# Patient Record
Sex: Male | Born: 1985 | Race: White | Hispanic: No | Smoking: Former smoker
Health system: Southern US, Community
[De-identification: ages and names within clinical notes are randomized; demographics above are authoritative.]

## PROBLEM LIST (undated history)

## (undated) DIAGNOSIS — F1911 Other psychoactive substance abuse, in remission: Secondary | ICD-10-CM

## (undated) DIAGNOSIS — I10 Essential (primary) hypertension: Secondary | ICD-10-CM

## (undated) HISTORY — DX: Essential (primary) hypertension: I10

## (undated) HISTORY — DX: Other psychoactive substance abuse, in remission: F19.11

---

## 2011-08-16 DIAGNOSIS — F1911 Other psychoactive substance abuse, in remission: Secondary | ICD-10-CM

## 2011-08-16 HISTORY — DX: Other psychoactive substance abuse, in remission: F19.11

## 2013-06-25 ENCOUNTER — Ambulatory Visit (INDEPENDENT_AMBULATORY_CARE_PROVIDER_SITE_OTHER): Payer: BC Managed Care – PPO | Admitting: Family Medicine

## 2013-06-25 ENCOUNTER — Encounter: Payer: Self-pay | Admitting: Family Medicine

## 2013-06-25 ENCOUNTER — Ambulatory Visit: Payer: Self-pay | Admitting: Family Medicine

## 2013-06-25 ENCOUNTER — Encounter (INDEPENDENT_AMBULATORY_CARE_PROVIDER_SITE_OTHER): Payer: Self-pay

## 2013-06-25 VITALS — BP 116/66 | HR 72 | Temp 97.0°F | Ht 72.0 in | Wt 285.8 lb

## 2013-06-25 DIAGNOSIS — I10 Essential (primary) hypertension: Secondary | ICD-10-CM

## 2013-06-25 MED ORDER — LISINOPRIL-HYDROCHLOROTHIAZIDE 10-12.5 MG PO TABS: 1.0000 | ORAL_TABLET | Freq: Every day | ORAL | Status: DC

## 2013-06-25 NOTE — Progress Notes (Signed)
  Subjective:    Patient ID: Darrell Henry, male    DOB: [birth year], 27 y.o.   MRN: [id number]  HPI This 27 y.o. male presents for evaluation of hypertension.  He just moved Down from up [place] and was seen in the last 3 months.  He needs refill On his bp medicine.   Review of Systems No chest pain, SOB, HA, dizziness, vision change, N/V, diarrhea, constipation, dysuria, urinary urgency or frequency, myalgias, arthralgias or rash.     Objective:   Physical Exam  Vital signs noted  Well developed well nourished male.  HEENT - Head atraumatic Normocephalic                Eyes - PERRLA, Conjuctiva - clear Sclera- Clear EOMI                Ears - EAC's Wnl TM's Wnl Gross Hearing WNL                Nose - Nares patent                 Throat - oropharanx wnl Respiratory - Lungs CTA bilateral Cardiac - RRR S1 and S2 without murmur GI - Abdomen soft Nontender and bowel sounds active x 4 Extremities - No edema. Neuro - Grossly intact.      Assessment & Plan:  Essential hypertension, benign - Plan: lisinopril-hydrochlorothiazide (PRINZIDE,ZESTORETIC) 10-12.5 MG per tablet.  Follow up in 6 months.  Deatra Canter FNP

## 2013-06-25 NOTE — Patient Instructions (Signed)

## 2013-12-24 ENCOUNTER — Ambulatory Visit: Payer: BC Managed Care – PPO | Admitting: Family Medicine

## 2014-02-13 ENCOUNTER — Ambulatory Visit (INDEPENDENT_AMBULATORY_CARE_PROVIDER_SITE_OTHER): Payer: Self-pay | Admitting: Family Medicine

## 2014-02-13 VITALS — BP 138/64 | HR 87 | Temp 98.2°F | Resp 18 | Ht 72.0 in | Wt 286.6 lb

## 2014-02-13 DIAGNOSIS — F1911 Other psychoactive substance abuse, in remission: Secondary | ICD-10-CM

## 2014-02-13 DIAGNOSIS — I10 Essential (primary) hypertension: Secondary | ICD-10-CM

## 2014-02-13 DIAGNOSIS — E669 Obesity, unspecified: Secondary | ICD-10-CM

## 2014-02-13 DIAGNOSIS — R0789 Other chest pain: Secondary | ICD-10-CM

## 2014-02-13 DIAGNOSIS — R0683 Snoring: Secondary | ICD-10-CM

## 2014-02-13 DIAGNOSIS — Z0289 Encounter for other administrative examinations: Secondary | ICD-10-CM

## 2014-02-13 NOTE — Progress Notes (Signed)
Patient ID: Darrell PettiesJeffrey Fetty, male   DOB: 06-05-1986, 28 y.o.   MRN: [id number]   Subjective:    Patient ID: Darrell Henry, male    DOB: 06-05-1986, 28 y.o.   MRN: [id number] This chart was scribed for Ethelda ChickKristi M Silvie Obremski, MD by Modena JanskyAlbert Thayil, ED Scribe. This patient was seen in Room 5 and the patient's care was started at 12:33 PM. 02/13/2014  DOT physical  HPI HPI Comments: Darrell Henry is a 28 y.o. male witwho presents to the [facility] complaining of a physical DOT.  Pt's last DOT physical was 13 months ago.   Pt states that he has being having episodes of HTN.  Denies AMI, CVA.   He also reports of episodes of neck pain and tingling. He reports no hx of any hospital visit involving an overnight stay. He reports no recent episodes of seizures.   He reports that his mother is alive and has DM and HTN. He states that his father has a hx of HTN and passed away at age 29 as result of DM. He also states that he has siblings with no medical problems. He reports that he recently quit smoking, and that he started when he was age 28.   He also reports no recent alcohol use and a DWI at age 28. He states that he currently does not use any other recreational drugs and that he has been clean from heroine for two years; he has been maintained on Suboxone for two years. He reports that he walks his dog, but does no other physical activity. He states that he takes Buprenorphrine and Lisinopril.  He admits that he did not report Suboxone use to DOT provider last year.   Pt denies any HA, dizziness, or SOB. He also denies any abdominal pain or trouble sleeping. He states that he just moved here 10 months ago from [place].    He reports of an episode of moderate chest pain that occurred about a month ago. He states that he was sitting prior to the episode that there were no modifying factors. He described the pain as "uncomfortable" and reports no SOB during the episode.  No exertional chest pain  and walks dog daily.  +snoring; +naps frequently; non-restorative sleep at times.  Mild intermittent hypersomnolence.  PC[place]  Review of Systems  Constitutional: Negative for fever, chills, diaphoresis and fatigue.  HENT: Negative for congestion, ear pain, hearing loss, postnasal drip, sinus pressure and tinnitus.   Eyes: Negative for photophobia and visual disturbance.  Respiratory: Negative for apnea, cough and shortness of breath.   Cardiovascular: Positive for chest pain. Negative for palpitations and leg swelling.  Gastrointestinal: Negative for nausea, vomiting, abdominal pain, diarrhea and constipation.  Endocrine: Negative for polydipsia, polyphagia and polyuria.  Genitourinary: Negative for discharge, penile swelling and testicular pain.  Musculoskeletal: Positive for neck pain. Negative for arthralgias, back pain, gait problem, joint swelling, myalgias and neck stiffness.  Skin: Negative for color change, pallor, rash and wound.  Neurological: Negative for dizziness, tremors, seizures, syncope, speech difficulty, weakness, light-headedness, numbness and headaches.  Psychiatric/Behavioral: Negative for sleep disturbance and dysphoric mood. The patient is not nervous/anxious.     Past Medical History  Diagnosis Date  . Hypertension   History reviewed. No pertinent past surgical history.  No Known Allergies Current Outpatient Prescriptions  Medication Sig Dispense Refill  . Buprenorphine HCl-Naloxone HCl (SUBOXONE) 8-2 MG FILM Place under the tongue. RX by Dr Warrick ParisianSheila Stallings  [facility]      . lisinopril-hydrochlorothiazide (PRINZIDE,ZESTORETIC) 10-12.5 MG per tablet Take 1 tablet by mouth daily.  30 tablet  11   No current facility-administered medications for this visit.   History   Social History  . Marital Status: Single    Spouse Name: N/A    Number of Children: N/A  . Years of Education: N/A   Occupational History  . Not on file.    Social History Main Topics  . Smoking status: Former Smoker -- 15 years    Types: Cigarettes  . Smokeless tobacco: Not on file     Comment: E CIG  . Alcohol Use: No     Comment: DWI age 28; no drinking since DWI.  Marland Kitchen. Drug Use: No     Comment: previous heroine addiction and narcotic addiction; Suboxone since 2013.  Marland Kitchen. Sexual Activity: Not on file   Other Topics Concern  . Not on file   Social History Narrative   Heroine abuse; suboxone since 2013.     Family History  Problem Relation Age of Onset  . Diabetes Mother   . Hypertension Mother   . Diabetes Father   . Hypertension Father         Objective:    BP 138/64  Pulse 87  Temp(Src) 98.2 F (36.8 C) (Oral)  Resp 18  Ht 6' (1.829 m)  Wt 286 lb 9.6 oz (130.001 kg)  BMI 38.86 kg/m2  SpO2 95% Physical Exam  Nursing note and vitals reviewed. Constitutional: He is oriented to person, place, and time. He appears well-developed and well-nourished. No distress.  obese  HENT:  Head: Normocephalic and atraumatic.  Right Ear: External ear normal.  Left Ear: External ear normal.  Nose: Nose normal.  Mouth/Throat: Oropharynx is clear and moist. No oropharyngeal exudate.  Eyes: Conjunctivae and EOM are normal. Pupils are equal, round, and reactive to light.  Neck: Normal range of motion. Neck supple. Carotid bruit is not present. No thyromegaly present.  No meningismus.  Cardiovascular: Normal rate, regular rhythm, normal heart sounds and intact distal pulses.  Exam reveals no gallop and no friction rub.   No murmur heard. Pulmonary/Chest: Effort normal and breath sounds normal. No respiratory distress. He has no wheezes. He has no rales.  Abdominal: Soft. Bowel sounds are normal. He exhibits no distension and no mass. There is no tenderness. There is no rebound and no guarding. Hernia confirmed negative in the right inguinal area.  Genitourinary: Testes normal. Circumcised.  Musculoskeletal: Normal range of motion. He  exhibits no edema and no tenderness.       Right shoulder: Normal.       Left shoulder: Normal.       Right knee: Normal.       Left knee: Normal.       Cervical back: Normal.       Thoracic back: Normal.       Lumbar back: Normal.  Lymphadenopathy:    He has no cervical adenopathy.       Right: No inguinal adenopathy present.  Neurological: He is alert and oriented to person, place, and time. He has normal reflexes. No cranial nerve deficit. He exhibits normal muscle tone. Coordination normal.  No ataxia on finger to nose bilaterally. No pronator drift. 5/5 strength throughout. CN 2-12 intact. Negative Romberg. Equal grip strength. Sensation intact. Gait is normal.   Skin: Skin is warm and dry. No rash noted. He is not diaphoretic.  Psychiatric: He has a  normal mood and affect. His behavior is normal. Judgment and thought content normal.   No results found for this or any previous visit.     Assessment & Plan:  Health examination of defined subpopulation  Essential hypertension, benign  Other chest pain  Obesity, unspecified  Snoring  H/O: substance abuse  1.  DOT CPE:  3 month card provided only due to suboxone use, obesity, snoring, chest pain.  Will contact [employer] regarding clearance with Suboxone use; likely will be disqualified because similar to methadone indications. 2.  HTN: controlled. 3. Obesity:  Recommend weight loss; warrants sleep study. 4.  Snoring:  New.  Associated with obesity, HTN.  Needs sleep study for extension of DOT card. 5.  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 6.  History of DWIs:  Three years ago.  No current signs of alcoholism; will also clarify with [employer] if needs ongoing counseling with [facility].    No orders of the defined types were placed in this encounter.    No  Follow-up on file.   I personally performed the services described in this documentation, which was scribed in my presence.  The recorded information has been reviewed and is accurate.  Nilda Simmer, M.D.  [facility] [address] [place], [place]  [address] [phone] phone [phone] fax

## 2014-02-13 NOTE — Patient Instructions (Signed)
1.  Follow-up with your primary care provider to discuss your recent chest pain and snoring.   2.  Due to obesity, snoring, hypertension and also due to your Commercial Driver's License, I recommend you undergoing a sleep study to evaluate for sleep apnea.  I also recommend weight loss. 3. I am giving you a three month DOT card to allow you time to undergo an office visit with your primary provider to evaluate your chest pain and to also schedule a sleep study.   4. Please call my office and leave a message when you have completed the following steps.

## 2014-06-19 ENCOUNTER — Other Ambulatory Visit: Payer: Self-pay | Admitting: Family Medicine

## 2014-07-20 ENCOUNTER — Other Ambulatory Visit: Payer: Self-pay | Admitting: Family Medicine

## 2014-07-21 NOTE — Telephone Encounter (Signed)
Refilled. Appt for 12/8 w/ Bill Oxford.

## 2014-07-22 ENCOUNTER — Ambulatory Visit (INDEPENDENT_AMBULATORY_CARE_PROVIDER_SITE_OTHER): Payer: BC Managed Care – PPO | Admitting: Family Medicine

## 2014-07-22 ENCOUNTER — Ambulatory Visit (INDEPENDENT_AMBULATORY_CARE_PROVIDER_SITE_OTHER): Payer: BC Managed Care – PPO | Admitting: *Deleted

## 2014-07-22 VITALS — BP 105/60 | HR 72 | Temp 97.7°F | Ht 72.0 in | Wt 287.6 lb

## 2014-07-22 DIAGNOSIS — Z23 Encounter for immunization: Secondary | ICD-10-CM

## 2014-07-22 DIAGNOSIS — I10 Essential (primary) hypertension: Secondary | ICD-10-CM

## 2014-07-22 LAB — POCT CBC
Granulocyte percent: 64.4 %G (ref 37–80)
HCT, POC: 42.7 % — AB (ref 43.5–53.7)
Hemoglobin: 14.5 g/dL (ref 14.1–18.1)
Lymph, poc: 1.8 (ref 0.6–3.4)
MCH, POC: 30 pg (ref 27–31.2)
MCHC: 33.9 g/dL (ref 31.8–35.4)
MCV: 88.5 fL (ref 80–97)
MPV: 7.9 fL (ref 0–99.8)
POC Granulocyte: 3.9 (ref 2–6.9)
POC LYMPH PERCENT: 28.9 %L (ref 10–50)
Platelet Count, POC: 233 10*3/uL (ref 142–424)
RBC: 4.8 M/uL (ref 4.69–6.13)
RDW, POC: 12.6 %
WBC: 6.1 10*3/uL (ref 4.6–10.2)

## 2014-07-22 MED ORDER — LISINOPRIL-HYDROCHLOROTHIAZIDE 10-12.5 MG PO TABS: 1.0000 | ORAL_TABLET | Freq: Every day | ORAL | Status: DC

## 2014-07-22 NOTE — Progress Notes (Signed)
   Subjective:    Patient ID: Darrell Henry, male    DOB: 23-Nov-1985, 28 y.o.   MRN: [id number]  HPI Patient is here for follow up on blood pressure.  He is due for lab work. He is due for flu shot.  Review of Systems  Constitutional: Negative for fever.  HENT: Negative for ear pain.   Eyes: Negative for discharge.  Respiratory: Negative for cough.   Cardiovascular: Negative for chest pain.  Gastrointestinal: Negative for abdominal distention.  Endocrine: Negative for polyuria.  Genitourinary: Negative for difficulty urinating.  Musculoskeletal: Negative for gait problem and neck pain.  Skin: Negative for color change and rash.  Neurological: Negative for speech difficulty and headaches.  Psychiatric/Behavioral: Negative for agitation.       Objective:    BP 105/60 mmHg  Pulse 72  Temp(Src) 97.7 F (36.5 C) (Oral)  Ht 6' (1.829 m)  Wt 287 lb 9.6 oz (130.455 kg)  BMI 39.00 kg/m2 Physical Exam  Constitutional: He is oriented to person, place, and time. He appears well-developed and well-nourished.  HENT:  Head: Normocephalic and atraumatic.  Mouth/Throat: Oropharynx is clear and moist.  Eyes: Pupils are equal, round, and reactive to light.  Neck: Normal range of motion. Neck supple.  Cardiovascular: Normal rate and regular rhythm.   No murmur heard. Pulmonary/Chest: Effort normal and breath sounds normal.  Abdominal: Soft. Bowel sounds are normal. There is no tenderness.  Neurological: He is alert and oriented to person, place, and time.  Skin: Skin is warm and dry.  Psychiatric: He has a normal mood and affect.          Assessment & Plan:     ICD-9-CM ICD-10-CM   1. Essential hypertension 401.9 I10 lisinopril-hydrochlorothiazide (PRINZIDE,ZESTORETIC) 10-12.5 MG per tablet     POCT CBC     CMP14+EGFR     Lipid panel     TSH   GET labs and flu shot  No Follow-up on file.  Lysbeth Penner FNP

## 2014-07-23 LAB — CMP14+EGFR
ALT: 46 IU/L — ABNORMAL HIGH (ref 0–44)
AST: 33 IU/L (ref 0–40)
Albumin/Globulin Ratio: 1.9 (ref 1.1–2.5)
Albumin: 4.3 g/dL (ref 3.5–5.5)
Alkaline Phosphatase: 39 IU/L (ref 39–117)
BUN/Creatinine Ratio: 19 (ref 8–19)
BUN: 16 mg/dL (ref 6–20)
CO2: 29 mmol/L (ref 18–29)
Calcium: 9.2 mg/dL (ref 8.7–10.2)
Chloride: 100 mmol/L (ref 97–108)
Creatinine, Ser: 0.86 mg/dL (ref 0.76–1.27)
GFR calc Af Amer: 135 mL/min/{1.73_m2} (ref >59)
GFR calc non Af Amer: 117 mL/min/{1.73_m2} (ref >59)
Globulin, Total: 2.3 g/dL (ref 1.5–4.5)
Glucose: 99 mg/dL (ref 65–99)
Potassium: 4.5 mmol/L (ref 3.5–5.2)
Sodium: 141 mmol/L (ref 134–144)
Total Bilirubin: 0.4 mg/dL (ref 0.0–1.2)
Total Protein: 6.6 g/dL (ref 6.0–8.5)

## 2014-07-23 LAB — LIPID PANEL
Chol/HDL Ratio: 4.7 ratio units (ref 0.0–5.0)
Cholesterol, Total: 170 mg/dL (ref 100–199)
HDL: 36 mg/dL — ABNORMAL LOW (ref >39)
LDL Calculated: 96 mg/dL (ref 0–99)
Triglycerides: 191 mg/dL — ABNORMAL HIGH (ref 0–149)
VLDL Cholesterol Cal: 38 mg/dL (ref 5–40)

## 2014-07-23 LAB — TSH: TSH: 1.85 u[IU]/mL (ref 0.450–4.500)

## 2015-08-06 ENCOUNTER — Encounter: Payer: Self-pay | Admitting: Family Medicine

## 2015-08-06 ENCOUNTER — Ambulatory Visit (INDEPENDENT_AMBULATORY_CARE_PROVIDER_SITE_OTHER): Payer: BLUE CROSS/BLUE SHIELD | Admitting: Family Medicine

## 2015-08-06 VITALS — BP 138/78 | HR 90 | Temp 97.9°F | Ht 73.0 in | Wt 289.8 lb

## 2015-08-06 DIAGNOSIS — Z0000 Encounter for general adult medical examination without abnormal findings: Secondary | ICD-10-CM

## 2015-08-06 DIAGNOSIS — I10 Essential (primary) hypertension: Secondary | ICD-10-CM | POA: Insufficient documentation

## 2015-08-06 MED ORDER — LISINOPRIL-HYDROCHLOROTHIAZIDE 10-12.5 MG PO TABS: 1.0000 | ORAL_TABLET | Freq: Every day | ORAL | Status: DC

## 2015-08-06 NOTE — Patient Instructions (Signed)
Great to meet you!  You have made good lifestyle changes, keep it up!  I think its fine to see you once a year  We will call with your labs within a week.

## 2015-08-06 NOTE — Progress Notes (Signed)
   HPI  Patient presents today to discuss hypertension.  Hypertension Doing well, good medication compliance Portion control, not watching salt Does not check blood pressure home No chest pain, palpitations, leg edema  Obesity Recently started running 15 minutes a day on a treadmill 7 days a week Also doing portion control  He's using Suboxone for addiction treatment  PMH: Smoking status noted ROS: Per HPI  Objective: BP 138/78 mmHg  Pulse 90  Temp(Src) 97.9 F (36.6 C) (Oral)  Ht '6\' 1"'$  (1.854 m)  Wt 289 lb 12.8 oz (131.452 kg)  BMI 38.24 kg/m2 Gen: NAD, alert, cooperative with exam HEENT: NCAT CV: RRR, good S1/S2, no murmur Resp: CTABL, no wheezes, non-labored Ext: No edema, warm Neuro: Alert and oriented, No gross deficits  Assessment and plan:  # Hypertension Blood pressure control Continue Prinzide Labs, nonfasting  # Obesity Congratulated on positive lifestyle changes, continue  # Healthcare maintenance HIV added to labs    Orders Placed This Encounter  Procedures  . CMP14+EGFR  . HIV antibody (with reflex)    Meds ordered this encounter  Medications  . lisinopril-hydrochlorothiazide (PRINZIDE,ZESTORETIC) 10-12.5 MG tablet    Sig: Take 1 tablet by mouth daily.    Dispense:  90 tablet    Refill:  [place], MD [facility] 08/06/2015, 2:46 PM

## 2015-08-07 LAB — CMP14+EGFR
ALT: 52 IU/L — AB (ref 0–44)
AST: 29 IU/L (ref 0–40)
Albumin/Globulin Ratio: 1.6 (ref 1.1–2.5)
Albumin: 4.3 g/dL (ref 3.5–5.5)
Alkaline Phosphatase: 37 IU/L — ABNORMAL LOW (ref 39–117)
BUN/Creatinine Ratio: 17 (ref 8–19)
BUN: 14 mg/dL (ref 6–20)
Bilirubin Total: 0.2 mg/dL (ref 0.0–1.2)
CHLORIDE: 98 mmol/L (ref 96–106)
CO2: 28 mmol/L (ref 18–29)
Calcium: 9.2 mg/dL (ref 8.7–10.2)
Creatinine, Ser: 0.81 mg/dL (ref 0.76–1.27)
GFR calc Af Amer: 138 mL/min/{1.73_m2} (ref >59)
GFR calc non Af Amer: 119 mL/min/{1.73_m2} (ref >59)
GLUCOSE: 152 mg/dL — AB (ref 65–99)
Globulin, Total: 2.7 g/dL (ref 1.5–4.5)
POTASSIUM: 4.3 mmol/L (ref 3.5–5.2)
Sodium: 140 mmol/L (ref 134–144)
Total Protein: 7 g/dL (ref 6.0–8.5)

## 2015-08-07 LAB — HIV ANTIBODY (ROUTINE TESTING W REFLEX): HIV Screen 4th Generation wRfx: NONREACTIVE

## 2015-08-26 ENCOUNTER — Other Ambulatory Visit: Payer: Self-pay | Admitting: Nurse Practitioner

## 2016-04-12 ENCOUNTER — Encounter: Payer: Self-pay | Admitting: Family Medicine

## 2016-04-12 ENCOUNTER — Ambulatory Visit (INDEPENDENT_AMBULATORY_CARE_PROVIDER_SITE_OTHER): Payer: BLUE CROSS/BLUE SHIELD | Admitting: Family Medicine

## 2016-04-12 VITALS — BP 130/77 | HR 95 | Temp 98.6°F | Ht 73.0 in | Wt 299.0 lb

## 2016-04-12 DIAGNOSIS — I10 Essential (primary) hypertension: Secondary | ICD-10-CM | POA: Diagnosis not present

## 2016-04-12 DIAGNOSIS — I878 Other specified disorders of veins: Secondary | ICD-10-CM | POA: Diagnosis not present

## 2016-04-12 NOTE — Patient Instructions (Signed)
Great to see you!  Come back in 6 weeks to discuss weight   You are making good changes, keep it up!

## 2016-04-12 NOTE — Progress Notes (Signed)
   HPI  Patient presents today here with leg swelling.  Patient explains that over the last 6 months he's had leg swelling that seems pretty much stable. There's been no worsening, it is not unilateral and is not bothersome. It's better in the morning.  He is also frustrated with his weight. He states that last week he crossed 300 pounds and this is a big turning point for him he has stopped sodas, fried and fatty foods, and fast food. He is interested in losing weight. He is also interested in appetite suppressant like phentermine.  He has history of IV drug abuse but has not used in 5 years. He denies history of fever, chills, sweats, or palpitations.  Hypertension Good medication compliance No chest pain, dyspnea, palpitations. Leg edema as above.  PMH: Smoking status noted ROS: Per HPI  Objective: BP 130/77   Pulse 95   Temp 98.6 F (37 C) (Oral)   Ht '6\' 1"'$  (1.854 m)   Wt 299 lb (135.6 kg)   BMI 39.45 kg/m  Gen: NAD, alert, cooperative with exam HEENT: NCAT CV: RRR, good S1/S2, no murmur Resp: CTABL, no wheezes, non-labored Abd: SNTND, BS present, no guarding or organomegaly Ext: 1+ pitting edema bilateral lower extremities Neuro: Alert and oriented, No gross deficits  Assessment and plan:  # Venous stasis Leg edema is consistent with venous stasis, likely due to morbid obesity and high salt content in his diet Discussed supportive care, possible compression stockings, but over arching issue is trying to lose weight.  Very careful auscultation of the heart reveals no murmurs, he is at increased risk for endocarditis and/or vegetation given history IV drug abuse. For now I will defer any echo.   # Morbid obesity Discussed not using phentermine with hypertension, and if used using low dose and only for short courses Encouraged diet and exercise Also discussed the possibility of bariatric surgery and medical weight loss therapy. Offered clinical pharmacist for  nutrition therapy today. He would like to pursue his current plan, begin exercising, and return to clinic in 6 weeks for weight check.   # Hypertension Well controlled on Prinzide, continue Labs    Orders Placed This Encounter  Procedures  . CMP14+EGFR  . CBC with Differential  . TSH  . Lipid panel     Laroy Apple, MD [facility] 04/12/2016, 9:41 AM

## 2016-04-13 LAB — TSH: TSH: 4.67 u[IU]/mL — AB (ref 0.450–4.500)

## 2016-04-13 LAB — CMP14+EGFR
ALK PHOS: 40 IU/L (ref 39–117)
ALT: 60 IU/L — AB (ref 0–44)
AST: 38 IU/L (ref 0–40)
Albumin/Globulin Ratio: 1.6 (ref 1.2–2.2)
Albumin: 4.4 g/dL (ref 3.5–5.5)
BUN/Creatinine Ratio: 15 (ref 9–20)
BUN: 13 mg/dL (ref 6–20)
Bilirubin Total: 0.4 mg/dL (ref 0.0–1.2)
CHLORIDE: 95 mmol/L — AB (ref 96–106)
CO2: 27 mmol/L (ref 18–29)
CREATININE: 0.84 mg/dL (ref 0.76–1.27)
Calcium: 9.5 mg/dL (ref 8.7–10.2)
GFR calc Af Amer: 135 mL/min/{1.73_m2} (ref >59)
GFR calc non Af Amer: 117 mL/min/{1.73_m2} (ref >59)
Globulin, Total: 2.8 g/dL (ref 1.5–4.5)
Glucose: 132 mg/dL — ABNORMAL HIGH (ref 65–99)
Potassium: 4.1 mmol/L (ref 3.5–5.2)
Sodium: 138 mmol/L (ref 134–144)
Total Protein: 7.2 g/dL (ref 6.0–8.5)

## 2016-04-13 LAB — CBC WITH DIFFERENTIAL/PLATELET
Basophils Absolute: 0 10*3/uL (ref 0.0–0.2)
Basos: 0 %
EOS (ABSOLUTE): 0.2 10*3/uL (ref 0.0–0.4)
EOS: 2 %
HEMOGLOBIN: 14.9 g/dL (ref 12.6–17.7)
Hematocrit: 43.1 % (ref 37.5–51.0)
IMMATURE GRANS (ABS): 0 10*3/uL (ref 0.0–0.1)
Immature Granulocytes: 0 %
Lymphocytes Absolute: 3 10*3/uL (ref 0.7–3.1)
Lymphs: 34 %
MCH: 30.3 pg (ref 26.6–33.0)
MCHC: 34.6 g/dL (ref 31.5–35.7)
MCV: 88 fL (ref 79–97)
MONOCYTES: 5 %
Monocytes Absolute: 0.4 10*3/uL (ref 0.1–0.9)
Neutrophils Absolute: 5.3 10*3/uL (ref 1.4–7.0)
Neutrophils: 59 %
Platelets: 257 10*3/uL (ref 150–379)
RBC: 4.91 x10E6/uL (ref 4.14–5.80)
RDW: 13.4 % (ref 12.3–15.4)
WBC: 8.9 10*3/uL (ref 3.4–10.8)

## 2016-04-13 LAB — LIPID PANEL
Chol/HDL Ratio: 4.8 ratio units (ref 0.0–5.0)
Cholesterol, Total: 157 mg/dL (ref 100–199)
HDL: 33 mg/dL — AB (ref >39)
LDL Calculated: 67 mg/dL (ref 0–99)
TRIGLYCERIDES: 286 mg/dL — AB (ref 0–149)
VLDL Cholesterol Cal: 57 mg/dL — ABNORMAL HIGH (ref 5–40)

## 2016-04-14 ENCOUNTER — Telehealth: Payer: Self-pay | Admitting: Family Medicine

## 2016-04-14 NOTE — Telephone Encounter (Signed)
Aware of lab results  

## 2016-04-15 LAB — HGB A1C W/O EAG: Hgb A1c MFr Bld: 5.6 % (ref 4.8–5.6)

## 2016-04-15 LAB — SPECIMEN STATUS REPORT

## 2016-05-03 ENCOUNTER — Encounter: Payer: Self-pay | Admitting: *Deleted

## 2016-05-24 ENCOUNTER — Ambulatory Visit (INDEPENDENT_AMBULATORY_CARE_PROVIDER_SITE_OTHER): Payer: BLUE CROSS/BLUE SHIELD | Admitting: Family Medicine

## 2016-05-24 ENCOUNTER — Encounter: Payer: Self-pay | Admitting: Family Medicine

## 2016-05-24 VITALS — BP 113/63 | HR 79 | Temp 98.8°F | Ht 73.0 in | Wt 283.2 lb

## 2016-05-24 DIAGNOSIS — I10 Essential (primary) hypertension: Secondary | ICD-10-CM

## 2016-05-24 DIAGNOSIS — Z23 Encounter for immunization: Secondary | ICD-10-CM

## 2016-05-24 DIAGNOSIS — I878 Other specified disorders of veins: Secondary | ICD-10-CM | POA: Insufficient documentation

## 2016-05-24 NOTE — Progress Notes (Signed)
   HPI  Patient presents today here for follow-up of weight loss of hypertension.  Hypertension Good medication compliance  Weight loss and obesity Patient has lost 16 pounds, he's cut out sodas, he's reduced other sweet foods. He's increased his walks to about 30 minutes 3-4 times a week, he's walking his dog and a shorter walk daily. He's interested in medications if possible  Leg swelling Improving slightly, has history of IV drug abuse and concern that this could be causing it. No palpitations or murmurs.  PMH: Smoking status noted ROS: Per HPI  Objective: BP 113/63   Pulse 79   Temp 98.8 F (37.1 C) (Oral)   Ht 6\' 1"  (1.854 m)   Wt 283 lb 3.2 oz (128.5 kg)   BMI 37.36 kg/m  Gen: NAD, alert, cooperative with exam HEENT: NCAT CV: RRR, good S1/S2, no murmur Resp: CTABL, no wheezes, non-labored Ext: 1 Plus pitting edema bilateral lower extremities symmetric Neuro: Alert and oriented, No gross deficits  Assessment and plan:  # Morbid obesity Weight improving, encouraged to continue positive diet changes, consider saxenda Offered bariatric referral Congratulated on positive changes F/u 2-3 monhs  # HTN Controlled Continue proinzide  # venous stasis Improved slightly, reassurance provided  Murtis SinkSam Sahand Gosch, MD [facility] 05/24/2016, 11:01 AM

## 2016-05-24 NOTE — Patient Instructions (Signed)
Great to see you!  Keep up the good work!  Lets follow up in 2 months  Consider Saxenda, check [email or web address] for coverage

## 2016-07-25 ENCOUNTER — Encounter: Payer: Self-pay | Admitting: Family Medicine

## 2016-07-25 ENCOUNTER — Ambulatory Visit (INDEPENDENT_AMBULATORY_CARE_PROVIDER_SITE_OTHER): Payer: BLUE CROSS/BLUE SHIELD | Admitting: Family Medicine

## 2016-07-25 VITALS — BP 115/58 | HR 80 | Temp 97.0°F | Ht 73.0 in | Wt 281.4 lb

## 2016-07-25 DIAGNOSIS — I10 Essential (primary) hypertension: Secondary | ICD-10-CM

## 2016-07-25 DIAGNOSIS — R0789 Other chest pain: Secondary | ICD-10-CM

## 2016-07-25 NOTE — Progress Notes (Signed)
   HPI  Patient presents today here for weight loss and hypertension, also with chest pain.  Weight loss Repeat lifestyle changes are going very well, still avoiding soda, fast food, and reducing portion sizes. Walking 20 minutes 4 times a week.  Chest pain Described as left-sided chest pain Dull and achy in nature, lasting 1-2 minutes Nonradiating Happens with rest, also happens with exertion, intermittent. Not predictable, sometimes occurs and sometimes doesn't Also new L facial/neck tick ( no radiation of pain) Recently started metal scrapping again.  No palps, sweating, SOB  HTN Good med compliance CP as above  PMH: Smoking status noted ROS: Per HPI  Objective: BP (!) 115/58   Pulse 80   Temp 97 F (36.1 C) (Oral)   Ht 6\' 1"  (1.854 m)   Wt 281 lb 6.4 oz (127.6 kg)   BMI 37.13 kg/m  Gen: NAD, alert, cooperative with exam HEENT: NCAT, EOMI, PERRL CV: RRR, good S1/S2, no murmur Resp: CTABL, no wheezes, non-labored Abd: SNTND, BS present, no guarding or organomegaly Ext: No edema, warm Neuro: Alert and oriented, No gross deficits  Assessment and plan:  # Atypical chest pain EKG within normal limits, very unlikely to be cardiac related I think pectoralis muscle spasms or more likely. This could be due to nerve irritation with his new left-sided facial and neck tic or due to recent increase in physical exertion. Reassurance provided Return to clinic as needed  # Weight loss, morbid obesity Slow progress nail Encouraged to start weight watchers or consider the Atkin's diet Increase intensity of aerobic exercise Return to clinic in 2 months  # Hypertension Well-controlled on Prinzide Continue current medications    Murtis SinkSam Dorothea Yow, MD [facility] 07/25/2016, 10:52 AM

## 2016-07-25 NOTE — Patient Instructions (Signed)
Great to see you!  Come back with any concerns, otherwise lets follow up in 2 months  Keep up the good work!

## 2016-08-17 ENCOUNTER — Encounter: Payer: Self-pay | Admitting: Family Medicine

## 2016-08-17 ENCOUNTER — Other Ambulatory Visit: Payer: Self-pay

## 2016-08-17 ENCOUNTER — Ambulatory Visit (INDEPENDENT_AMBULATORY_CARE_PROVIDER_SITE_OTHER): Payer: BLUE CROSS/BLUE SHIELD | Admitting: Family Medicine

## 2016-08-17 ENCOUNTER — Ambulatory Visit (HOSPITAL_COMMUNITY)
Admission: RE | Admit: 2016-08-17 | Discharge: 2016-08-17 | Disposition: A | Discharge status: Home / Self Care | Payer: BLUE CROSS/BLUE SHIELD | Source: Ambulatory Visit | Attending: Family Medicine | Admitting: Family Medicine

## 2016-08-17 VITALS — BP 108/61 | HR 84 | Temp 97.7°F | Ht 73.0 in | Wt 280.0 lb

## 2016-08-17 DIAGNOSIS — M79605 Pain in left leg: Secondary | ICD-10-CM

## 2016-08-17 DIAGNOSIS — I878 Other specified disorders of veins: Secondary | ICD-10-CM | POA: Diagnosis not present

## 2016-08-17 DIAGNOSIS — R600 Localized edema: Secondary | ICD-10-CM | POA: Diagnosis not present

## 2016-08-17 DIAGNOSIS — R590 Localized enlarged lymph nodes: Secondary | ICD-10-CM | POA: Insufficient documentation

## 2016-08-17 DIAGNOSIS — R2242 Localized swelling, mass and lump, left lower limb: Secondary | ICD-10-CM | POA: Diagnosis not present

## 2016-08-17 MED ORDER — AMOXICILLIN-POT CLAVULANATE 875-125 MG PO TABS: 1.0000 | ORAL_TABLET | Freq: Two times a day (BID) | ORAL | 0 refills | Status: DC

## 2016-08-17 NOTE — Progress Notes (Signed)
Subjective:  Patient ID: Darrell Henry, male    DOB: 09-16-1985  Age: 31 y.o. MRN: [id number]  CC: Leg Swelling (pt here today for left lower leg redness and swelling also a knot in the left inguinal area )   HPI Darrell Henry presents for Symptoms noted above. Onset in the last few days. Noted to not yesterday. Area is moderately sore at the inguinal area somewhat more painful at the lower leg. He's had poor circulation noted in the past.   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   He has no past surgical history on file.   His family history includes Diabetes in his father and mother; Hypertension in his father and mother.He reports that he has quit smoking. His smoking use included Cigarettes. He quit after 15.00 years of use. He has never used smokeless tobacco. He reports that he does not drink alcohol or use drugs.    ROS Review of Systems Normal with the exception of history of present illness   Objective:  BP 108/61   Pulse 84   Temp 97.7 F (36.5 C) (Oral)   Ht 6\' 1"  (1.854 m)   Wt 280 lb (127 kg)   BMI 36.94 kg/m   BP Readings from Last 3 Encounters:  08/23/16 (!) 113/54  08/17/16 108/61  07/25/16 (!) 115/58    Wt Readings from Last 3 Encounters:  08/23/16 278 lb (126.1 kg)  08/17/16 280 lb (127 kg)  07/25/16 281 lb 6.4 oz (127.6 kg)     Physical Exam  Constitutional: He appears well-developed and well-nourished.  HENT:  Head: Normocephalic and atraumatic.  Right Ear: Tympanic membrane and external ear normal. No decreased hearing is noted.  Left Ear: Tympanic membrane and external ear normal. No decreased hearing is noted.  Mouth/Throat: No oropharyngeal exudate or posterior oropharyngeal erythema.  Eyes: Pupils are equal, round, and reactive to light.  Neck: Normal range of motion. Neck supple.  Cardiovascular: Normal rate and regular rhythm.   No murmur heard. Pulmonary/Chest:  Breath sounds normal. No respiratory distress.  Abdominal: Soft. Bowel sounds are normal. He exhibits no mass. There is no tenderness.  Skin: There is erythema (Confluent erythema at the anterior left shin. covering much of the area).  Psychiatric: He has a normal mood and affect.  Vitals reviewed.    Lab Results  Component Value Date   WBC 8.9 04/12/2016   HGB 14.5 07/22/2014   HCT 43.1 04/12/2016   PLT 257 04/12/2016   GLUCOSE 132 (H) 04/12/2016   CHOL 157 04/12/2016   TRIG 286 (H) 04/12/2016   HDL 33 (L) 04/12/2016   LDLCALC 67 04/12/2016   ALT 60 (H) 04/12/2016   AST 38 04/12/2016   NA 138 04/12/2016   K 4.1 04/12/2016   CL 95 (L) 04/12/2016   CREATININE 0.84 04/12/2016   BUN 13 04/12/2016   CO2 27 04/12/2016   TSH 4.670 (H) 04/12/2016   HGBA1C 5.6 04/12/2016    Patient was never admitted.  Assessment & Plan:   Darrell Henry was seen today for leg swelling.  Diagnoses and all orders for this visit:  Leg edema, left -     Ultrasound doppler venous legs bilat; Future  Venous stasis -     Ultrasound doppler venous legs bilat; Future  Morbid obesity (HCC) -     Ultrasound doppler venous legs bilat; Future  Other orders -     Discontinue: amoxicillin-clavulanate (AUGMENTIN) 875-125 MG  tablet; Take 1 tablet by mouth 2 (two) times daily. Take all of this medication    I am having Darrell Henry maintain his lisinopril-hydrochlorothiazide and SUBOXONE.  Meds ordered this encounter  Medications  . DISCONTD: amoxicillin-clavulanate (AUGMENTIN) 875-125 MG tablet    Sig: Take 1 tablet by mouth 2 (two) times daily. Take all of this medication    Dispense:  20 tablet    Refill:  0     Follow-up: Return in about 7 days (around 08/24/2016).  Mechele ClaudeWarren Daziya Redmond, M.D.

## 2016-08-17 NOTE — Progress Notes (Signed)
Patient aware.

## 2016-08-23 ENCOUNTER — Ambulatory Visit (INDEPENDENT_AMBULATORY_CARE_PROVIDER_SITE_OTHER): Payer: BLUE CROSS/BLUE SHIELD | Admitting: Family Medicine

## 2016-08-23 ENCOUNTER — Encounter: Payer: Self-pay | Admitting: Family Medicine

## 2016-08-23 VITALS — BP 113/54 | HR 72 | Temp 97.0°F | Ht 73.0 in | Wt 278.0 lb

## 2016-08-23 DIAGNOSIS — L03116 Cellulitis of left lower limb: Secondary | ICD-10-CM | POA: Diagnosis not present

## 2016-08-23 DIAGNOSIS — I878 Other specified disorders of veins: Secondary | ICD-10-CM | POA: Diagnosis not present

## 2016-08-23 DIAGNOSIS — R600 Localized edema: Secondary | ICD-10-CM

## 2016-08-23 MED ORDER — AMOXICILLIN-POT CLAVULANATE 875-125 MG PO TABS: 1.0000 | ORAL_TABLET | Freq: Two times a day (BID) | ORAL | 0 refills | Status: DC

## 2016-08-23 NOTE — Progress Notes (Signed)
Subjective:  Patient ID: Darrell Henry, male    DOB: 1986-05-26  Age: 31 y.o. MRN: [id number]  CC: Cellulitis (pt here today for 1 week follow up on cellulitis of left lower leg, he is still on the Augmentin)   HPI Darrell Henry. Still has edema LLE. Used to weight about 300 lb. Has had swelling in the leg before but this is the first infection. Doppler result was negative for DVT. Some soreness, no pain. Performing usual activities.   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   He has no past surgical history on file.   His family history includes Diabetes in his father and mother; Hypertension in his father and mother.He reports that he has quit smoking. His smoking use included Cigarettes. He quit after 15.00 years of use. He has never used smokeless tobacco. He reports that he does not drink alcohol or use drugs.    ROS Review of Systems  Constitutional: Negative for chills, diaphoresis and fever.  HENT: Negative for rhinorrhea and sore throat.   Respiratory: Negative for cough and shortness of breath.   Cardiovascular: Negative for chest pain.  Gastrointestinal: Negative for abdominal pain.  Musculoskeletal: Negative for arthralgias and myalgias.  Skin: Negative for rash.  Neurological: Negative for weakness and headaches.    Objective:  BP (!) 113/54   Pulse 72   Temp 97 F (36.1 C) (Oral)   Ht 6\' 1"  (1.854 m)   Wt 278 lb (126.1 kg)   BMI 36.68 kg/m   BP Readings from Last 3 Encounters:  08/23/16 (!) 113/54  08/17/16 108/61  07/25/16 (!) 115/58    Wt Readings from Last 3 Encounters:  08/23/16 278 lb (126.1 kg)  08/17/16 280 lb (127 kg)  07/25/16 281 lb 6.4 oz (127.6 kg)     Physical Exam  Constitutional: He appears well-developed and well-nourished.  HENT:  Head: Normocephalic and atraumatic.  Right Ear: Tympanic membrane and external ear normal. No decreased  hearing is noted.  Left Ear: Tympanic membrane and external ear normal. No decreased hearing is noted.  Mouth/Throat: No posterior oropharyngeal erythema.  Eyes: Pupils are equal, round, and reactive to light.  Neck: Normal range of motion. Neck supple.  Cardiovascular: Normal rate and regular rhythm.   No murmur heard. Pulmonary/Chest: Breath sounds normal. No respiratory distress.  Abdominal: Soft. Bowel sounds are normal. He exhibits no mass. There is no tenderness.  Musculoskeletal: He exhibits edema (2+ LLE to tibial tuberosity).  Skin: There is erythema (has decreased from last check in intensity. Still covers anterior lower left leg).  Vitals reviewed.    Lab Results  Component Value Date   WBC 8.9 04/12/2016   HGB 14.5 07/22/2014   HCT 43.1 04/12/2016   PLT 257 04/12/2016   GLUCOSE 132 (H) 04/12/2016   CHOL 157 04/12/2016   TRIG 286 (H) 04/12/2016   HDL 33 (L) 04/12/2016   LDLCALC 67 04/12/2016   ALT 60 (H) 04/12/2016   AST 38 04/12/2016   NA 138 04/12/2016   K 4.1 04/12/2016   CL 95 (L) 04/12/2016   CREATININE 0.84 04/12/2016   BUN 13 04/12/2016   CO2 27 04/12/2016   TSH 4.670 (H) 04/12/2016   HGBA1C 5.6 04/12/2016    Assessment & Plan:   Darrell Henry was seen today for cellulitis.  Diagnoses and all orders for this visit:  Venous stasis -     Ambulatory referral to  Vascular Surgery  Leg edema, left -     Ambulatory referral to Vascular Surgery  Cellulitis of leg, left -     Ambulatory referral to Vascular Surgery  Other orders -     amoxicillin-clavulanate (AUGMENTIN) 875-125 MG tablet; Take 1 tablet by mouth 2 (two) times daily. Take all of this medication    Follow up soon with Dr. Ermalinda Henry for thyroid.  Continue weight loss. Ultimate goal 220.  I am having Darrell Henry maintain his lisinopril-hydrochlorothiazide, SUBOXONE, and amoxicillin-clavulanate.  Meds ordered this encounter  Medications  . amoxicillin-clavulanate (AUGMENTIN) 875-125 MG  tablet    Sig: Take 1 tablet by mouth 2 (two) times daily. Take all of this medication    Dispense:  20 tablet    Refill:  0     Follow-up: Return if symptoms worsen or fail to improve after antibiotic & vascular eval.  Mechele Claude, M.D.

## 2016-08-25 ENCOUNTER — Other Ambulatory Visit: Payer: Self-pay | Admitting: *Deleted

## 2016-08-25 DIAGNOSIS — I872 Venous insufficiency (chronic) (peripheral): Secondary | ICD-10-CM

## 2016-09-26 ENCOUNTER — Ambulatory Visit (INDEPENDENT_AMBULATORY_CARE_PROVIDER_SITE_OTHER): Payer: BLUE CROSS/BLUE SHIELD | Admitting: Family Medicine

## 2016-09-26 ENCOUNTER — Encounter: Payer: Self-pay | Admitting: Family Medicine

## 2016-09-26 VITALS — BP 109/64 | HR 78 | Temp 97.1°F | Ht 73.0 in | Wt 277.4 lb

## 2016-09-26 DIAGNOSIS — I10 Essential (primary) hypertension: Secondary | ICD-10-CM

## 2016-09-26 DIAGNOSIS — I878 Other specified disorders of veins: Secondary | ICD-10-CM | POA: Diagnosis not present

## 2016-09-26 DIAGNOSIS — R946 Abnormal results of thyroid function studies: Secondary | ICD-10-CM | POA: Diagnosis not present

## 2016-09-26 DIAGNOSIS — R7989 Other specified abnormal findings of blood chemistry: Secondary | ICD-10-CM

## 2016-09-26 MED ORDER — LISINOPRIL-HYDROCHLOROTHIAZIDE 10-12.5 MG PO TABS: 1.0000 | ORAL_TABLET | Freq: Every day | ORAL | 3 refills | Status: DC

## 2016-09-26 NOTE — Progress Notes (Signed)
   HPI  Patient presents today here for follow-up chronic medical conditions.  Hypertension Doing well with lisinopril HCTZ No dizziness  Leg swelling Patient had episode of venous stasis dermatitis complicated by cellulitis in January. He had an ultrasound ruling out blood clot. Patient states leg swelling is improved quite a bit, he has an appointment with vascular surgery.  Elevated TSH Asymptomatic  PMH: Smoking status noted ROS: Per HPI  Objective: BP 109/64   Pulse 78   Temp 97.1 F (36.2 C) (Oral)   Ht '6\' 1"'$  (1.854 m)   Wt 277 lb 6.4 oz (125.8 kg)   BMI 36.60 kg/m  Gen: NAD, alert, cooperative with exam HEENT: NCAT CV: RRR, good S1/S2, no murmur Resp: CTABL, no wheezes, non-labored Ext: 1+ pitting edema bilateral lower extremity slightly worse on the left, calf circumference is 46.5 cm on left, 45.5 cm on right, mild erythema with no warmth of the lower left leg., Mild tenderness with checking edema. Neuro: Alert and oriented, No gross deficits  Assessment and plan:  # Leg swelling- venous stasis Improved, cellulitis in Jan resolved, DVT was ruled out using ultrasound on January 3. Pt has vasc surg appt for f/u Chronic venous stasis- supportive care for now  # Elevated TSH  Asymptomatic Recheck today  # Hypertension  blood pressure well controlled on Prinzide Labs - ALT elevated last visit  Graduated patient on weight loss   Orders Placed This Encounter  Procedures  . TSH  . CMP14+EGFR    Meds ordered this encounter  Medications  . lisinopril-hydrochlorothiazide (PRINZIDE,ZESTORETIC) 10-12.5 MG tablet    Sig: Take 1 tablet by mouth daily.    Dispense:  90 tablet    Refill:  [place], MD [place] 09/26/2016, 11:09 AM

## 2016-09-26 NOTE — Patient Instructions (Signed)
Great to see you!  Lets follow up in 6 months unless you need us sooner.  

## 2016-09-27 ENCOUNTER — Telehealth: Payer: Self-pay | Admitting: Family Medicine

## 2016-09-27 LAB — CMP14+EGFR
ALBUMIN: 4.3 g/dL (ref 3.5–5.5)
ALT: 27 IU/L (ref 0–44)
AST: 20 IU/L (ref 0–40)
Albumin/Globulin Ratio: 1.7 (ref 1.2–2.2)
Alkaline Phosphatase: 34 IU/L — ABNORMAL LOW (ref 39–117)
BUN / CREAT RATIO: 14 (ref 9–20)
BUN: 13 mg/dL (ref 6–20)
Bilirubin Total: 0.5 mg/dL (ref 0.0–1.2)
CALCIUM: 9.4 mg/dL (ref 8.7–10.2)
CHLORIDE: 99 mmol/L (ref 96–106)
CO2: 30 mmol/L — AB (ref 18–29)
CREATININE: 0.9 mg/dL (ref 0.76–1.27)
GFR, EST AFRICAN AMERICAN: 131 mL/min/{1.73_m2} (ref >59)
GFR, EST NON AFRICAN AMERICAN: 113 mL/min/{1.73_m2} (ref >59)
GLUCOSE: 91 mg/dL (ref 65–99)
Globulin, Total: 2.6 g/dL (ref 1.5–4.5)
Potassium: 4.2 mmol/L (ref 3.5–5.2)
Sodium: 141 mmol/L (ref 134–144)
TOTAL PROTEIN: 6.9 g/dL (ref 6.0–8.5)

## 2016-09-27 LAB — T4, FREE: Free T4: 1.09 ng/dL (ref 0.82–1.77)

## 2016-09-27 LAB — TSH: TSH: 3.34 u[IU]/mL (ref 0.450–4.500)

## 2016-09-27 NOTE — Telephone Encounter (Signed)
Aware of results. 

## 2016-09-30 ENCOUNTER — Other Ambulatory Visit: Payer: Self-pay | Admitting: Family Medicine

## 2016-09-30 DIAGNOSIS — I10 Essential (primary) hypertension: Secondary | ICD-10-CM

## 2016-10-04 ENCOUNTER — Encounter: Payer: Self-pay | Admitting: Vascular Surgery

## 2016-10-07 ENCOUNTER — Ambulatory Visit (INDEPENDENT_AMBULATORY_CARE_PROVIDER_SITE_OTHER): Payer: BLUE CROSS/BLUE SHIELD | Admitting: Vascular Surgery

## 2016-10-07 ENCOUNTER — Ambulatory Visit (HOSPITAL_COMMUNITY)
Admission: RE | Admit: 2016-10-07 | Discharge: 2016-10-07 | Disposition: A | Discharge status: Home / Self Care | Payer: BLUE CROSS/BLUE SHIELD | Source: Ambulatory Visit | Attending: Vascular Surgery | Admitting: Vascular Surgery

## 2016-10-07 ENCOUNTER — Encounter: Payer: Self-pay | Admitting: Vascular Surgery

## 2016-10-07 VITALS — BP 128/68 | HR 86 | Temp 97.4°F | Resp 24 | Ht 72.0 in | Wt 279.6 lb

## 2016-10-07 DIAGNOSIS — I872 Venous insufficiency (chronic) (peripheral): Secondary | ICD-10-CM | POA: Insufficient documentation

## 2016-10-07 DIAGNOSIS — I868 Varicose veins of other specified sites: Secondary | ICD-10-CM

## 2016-10-07 NOTE — Progress Notes (Signed)
Patient ID: Darrell Henry, male   DOB: 1985-12-24, 31 y.o.   MRN: [id number]  Reason for Consult: Venous Insufficiency ([facility].  He has BLE edema L>R, LLE cellulitis, pt has chosen not to use compression socks up until now. )   Referred by Elenora GammaBradshaw, Samuel L, MD  Subjective:     HPI:  Darrell Henry is a 31 y.o. male with history of obesity previously weighing 300 pounds now has lost approximately 30 pounds. He has swelling in his left lower extremity but does cause him some discomfort but does not cause frank pain. 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 He denies ever having trauma to his left leg has never had blood clots and does not have family history to suggest hypercoagulable disorder. He has had an episode of cellulitis in his left lower extremity which healed with antibiotic treatment and he thinks was the initiation of his lower extremity edema. He has not tried compression stockings. He does have some alleviation with elevating his leg and at night when in bed. Exacerbating factors include being on his feet and other activities.  Past Medical History:  Diagnosis Date  . Hypertension   . 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   Family History  Problem Relation Age of Onset  . Diabetes Mother   . Hypertension Mother   . Diabetes Father   . Hypertension Father    No past surgical history on file.  Short Social History:  Social History  Substance Use Topics  . Smoking status: Former Smoker    Years: 15.00    Types: Cigarettes  . Smokeless tobacco: Never Used     Comment: E CIG vape   . Alcohol use No     Comment: DWI age 31; no drinking since DWI.    No Known Allergies  Current Outpatient Prescriptions  Medication Sig Dispense Refill  . Multiple Vitamin (MULTIVITAMIN) capsule Take 1  capsule by mouth daily.    Marland Kitchen. lisinopril-hydrochlorothiazide (PRINZIDE,ZESTORETIC) 10-12.5 MG tablet Take 1 tablet by mouth daily. 90 tablet 3  . SUBOXONE 2-0.5 MG FILM DISSOLVE ONE AND A HALF FILMS UNDER THE TONGUE EVERY DAY  0   No current facility-administered medications for this visit.     Review of Systems  Constitutional:  Constitutional negative. HENT: HENT negative.  Eyes: Eyes negative.  Respiratory: Respiratory negative.  Cardiovascular: Positive for leg swelling.  GI: Gastrointestinal negative.  GU: Genitourinary negative. Musculoskeletal: Musculoskeletal negative.  Skin: Positive for rash.  Neurological: Neurological negative. Hematologic: Hematologic/lymphatic negative.  Psychiatric: Psychiatric negative.        Objective:  Objective   Vitals:   10/07/16 1229  BP: 128/68  Pulse: 86  Resp: (!) 24  Temp: 97.4 F (36.3 C)  TempSrc: Oral  SpO2: 97%  Weight: 279 lb 9.6 oz (126.8 kg)  Height: 6' (1.829 m)   Body mass index is 37.92 kg/m.  Physical Exam  Constitutional: He is oriented to person, place, and time. He appears well-developed.  HENT:  Head: Normocephalic.  Eyes: Pupils are equal, round, and reactive to light.  Neck: Normal range of motion.  Cardiovascular: Normal rate.   Pulmonary/Chest: Effort normal.  Abdominal: Soft. He exhibits no mass.  Musculoskeletal: Normal range of motion. He exhibits edema.  Neurological: He is alert and oriented to person, place, and time.  Skin: Skin is warm and dry.  Psychiatric: He has a normal mood and affect. His behavior is normal. Judgment and thought content normal.    Data: Venous incompetence noted in the great saphenous vein incompetent veins on the left. Saphenofemoral junction 0.72 cm and has reflux really throughout.     Assessment/Plan:     31 year old gentleman without history of DVT but does have history of obesity having recently what lost weight approximately 30 pounds. He has nonpitting  edema of his left lower extremity which causes him minimal discomfort at this time. He also has significant reflux in his greater saphenous vein on the left. This gives him C3 venous disease. We'll get him fitted for thigh-high 20-30 mmHg stockings today and we discussed instructions on how to use these and he agrees to do so. We also discussed continued weight loss which would possibly improve his symptoms as well. He will follow-up in 3 months and the vein center where he can discuss possible greater saphenous vein ablation. He demonstrates very good understanding.     Maeola Harman MD [facility]

## 2016-12-06 IMAGING — US US EXTREM LOW VENOUS*L*
1 series · 13 of 24 positions shown · non-contrast
Comparison: None.

CLINICAL DATA: Left lower leg pain and swelling for 3 days. Venous
stasis disease. Morbid obesity.



[Series 1: us extrem low venous*left* · 0.09mm/px · 13 of 56 slices shown]
[im 1/56]
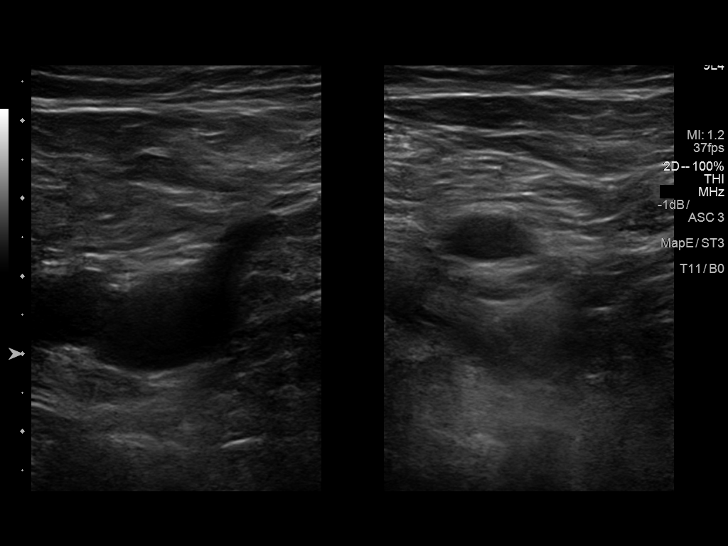
[im 5/56]
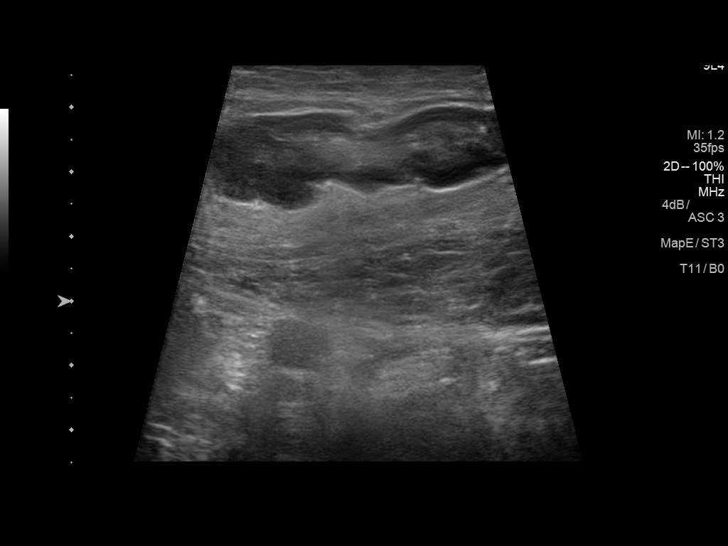
[im 10/56]
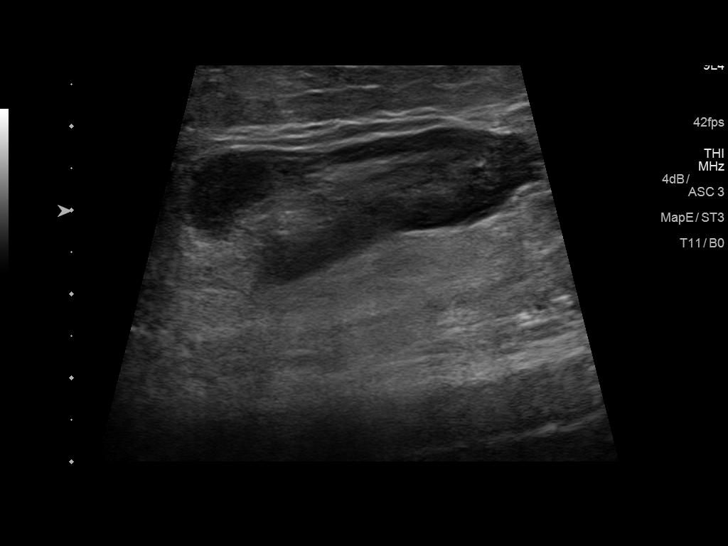
[im 15/56]
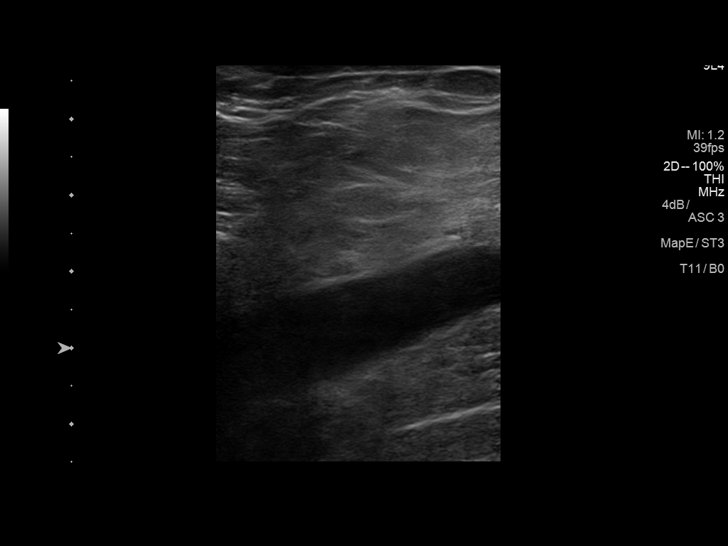
[im 20/56]
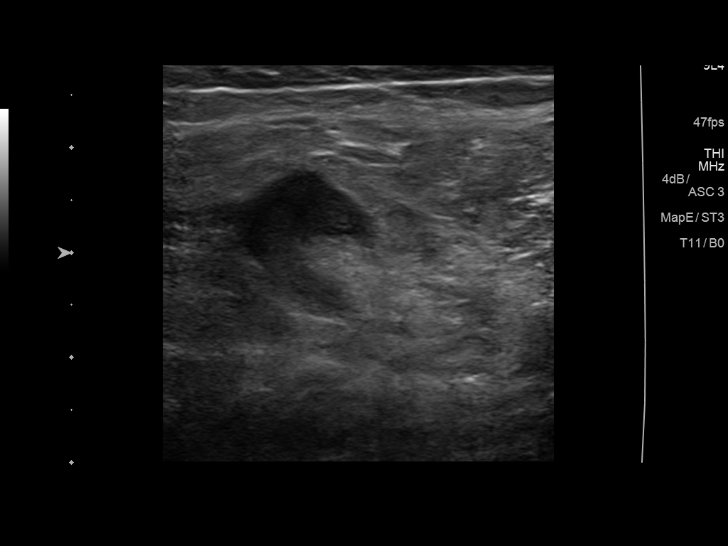
[im 24/56]
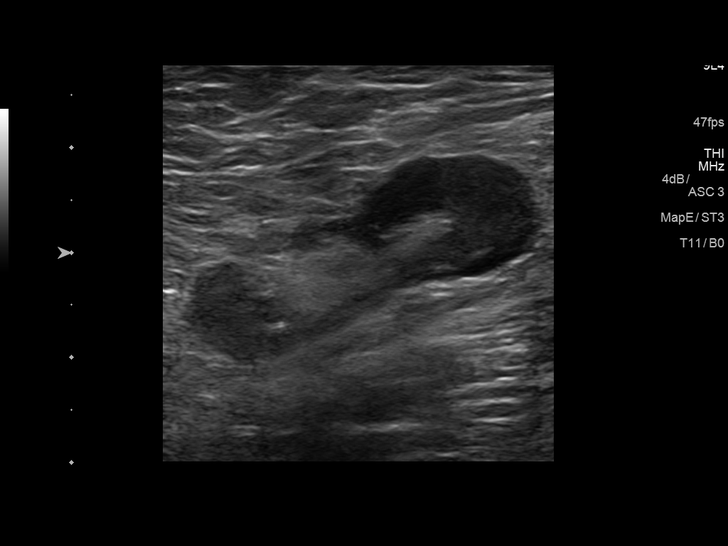
[im 29/56]
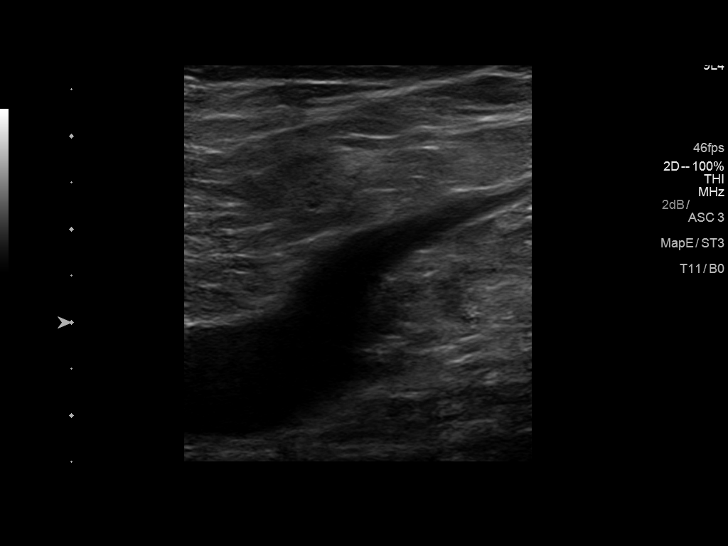
[im 32/56]
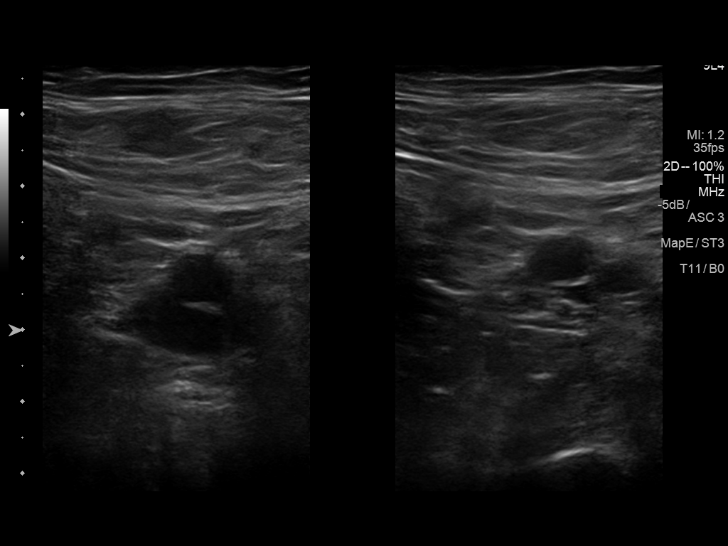
[im 36/56]
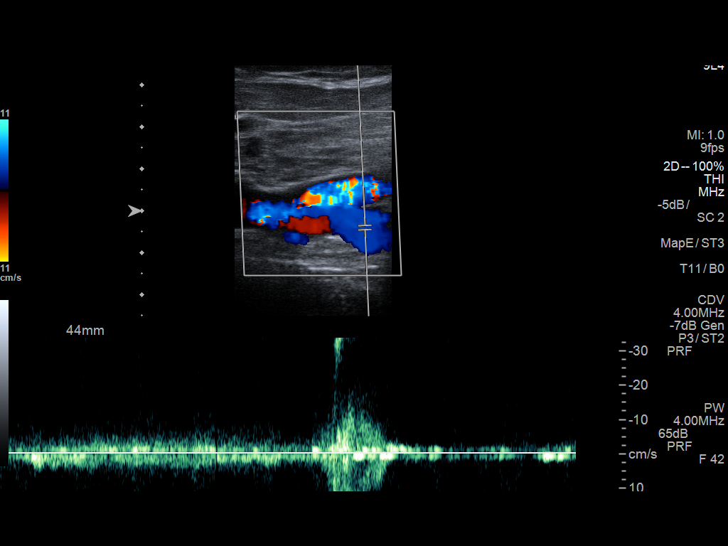
[im 41/56]
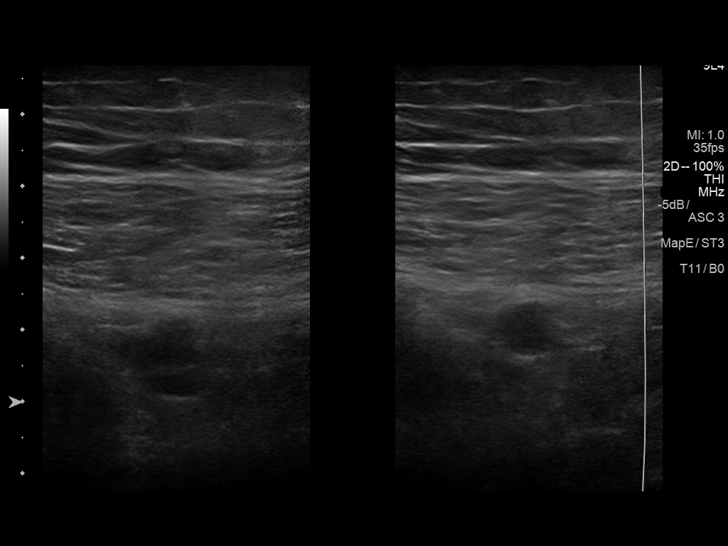
[im 46/56]
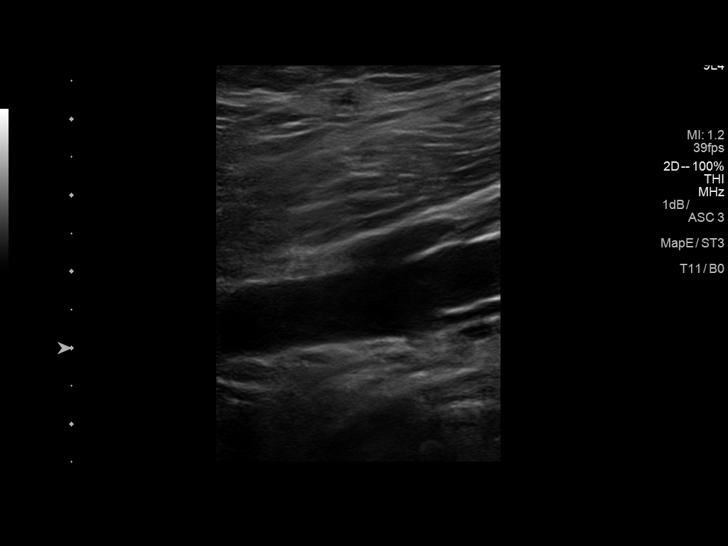
[im 51/56]
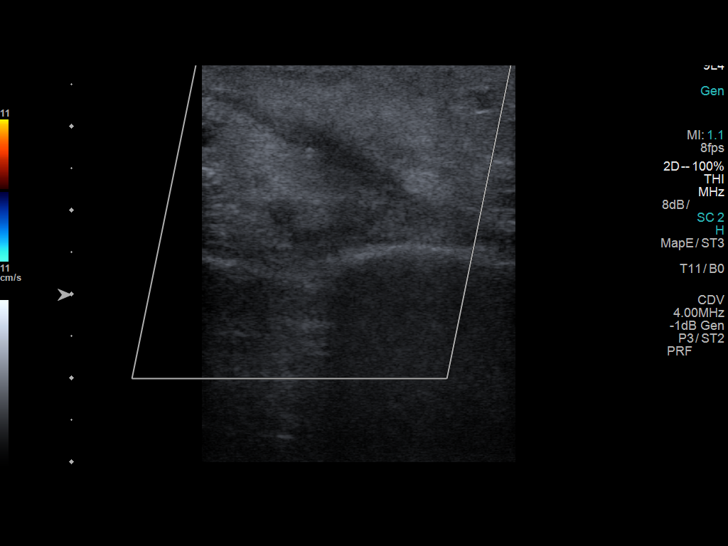
[im 56/56]
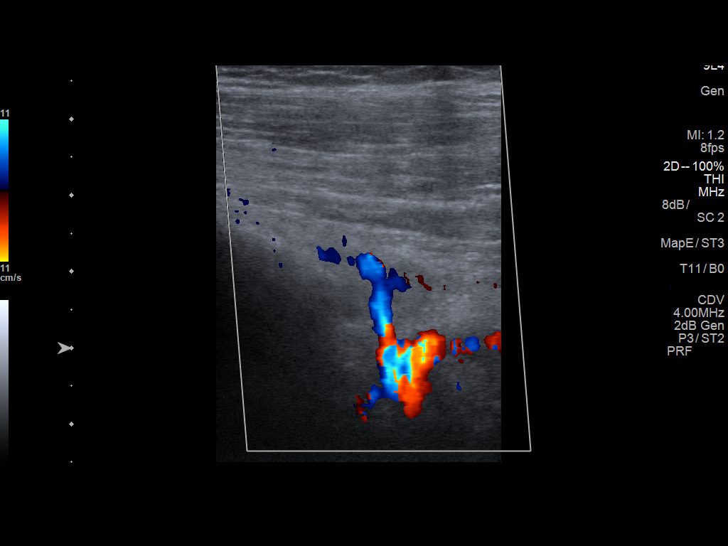

[13 of 24 positions shown; findings below may reference images not displayed]

FINDINGS: Contralateral Common Femoral Vein: Respiratory phasicity is normal
and symmetric with the symptomatic side. No evidence of thrombus.
Normal compressibility.

Common Femoral Vein: No evidence of thrombus. Normal
compressibility, respiratory phasicity and response to augmentation.

Saphenofemoral Junction: No evidence of thrombus. Normal
compressibility and flow on color Doppler imaging.

Profunda Femoral Vein: No evidence of thrombus. Normal
compressibility and flow on color Doppler imaging.

Femoral Vein: No evidence of thrombus. Normal compressibility,
respiratory phasicity and response to augmentation.

Popliteal Vein: No evidence of thrombus. Normal compressibility,
respiratory phasicity and response to augmentation.

Calf Veins: No evidence of thrombus. Normal compressibility and flow
on color Doppler imaging.

Superficial Great Saphenous Vein: No evidence of thrombus. Normal
compressibility and flow on color Doppler imaging.

Venous Reflux:  None.

Other Findings: Enlarged left inguinal lymph nodes noted measuring
1.6 cm in short axis but having preserved cortex and fatty hila.
Suspect reactive.
IMPRESSION: Negative for significant left lower extremity DVT.

Mild left inguinal adenopathy.

## 2016-12-30 ENCOUNTER — Encounter: Payer: Self-pay | Admitting: Vascular Surgery

## 2017-01-10 ENCOUNTER — Ambulatory Visit: Payer: BLUE CROSS/BLUE SHIELD | Admitting: Vascular Surgery

## 2017-03-27 ENCOUNTER — Ambulatory Visit: Payer: BLUE CROSS/BLUE SHIELD | Admitting: Family Medicine

## 2017-03-31 ENCOUNTER — Ambulatory Visit: Payer: BLUE CROSS/BLUE SHIELD | Admitting: Family Medicine

## 2017-04-03 ENCOUNTER — Encounter: Payer: Self-pay | Admitting: Family Medicine

## 2017-04-04 ENCOUNTER — Ambulatory Visit (INDEPENDENT_AMBULATORY_CARE_PROVIDER_SITE_OTHER): Payer: BLUE CROSS/BLUE SHIELD | Admitting: Family Medicine

## 2017-04-04 ENCOUNTER — Encounter: Payer: Self-pay | Admitting: Family Medicine

## 2017-04-04 VITALS — BP 111/68 | HR 84 | Temp 97.9°F | Ht 72.0 in | Wt 283.0 lb

## 2017-04-04 DIAGNOSIS — R0789 Other chest pain: Secondary | ICD-10-CM | POA: Diagnosis not present

## 2017-04-04 DIAGNOSIS — I10 Essential (primary) hypertension: Secondary | ICD-10-CM

## 2017-04-04 DIAGNOSIS — H538 Other visual disturbances: Secondary | ICD-10-CM

## 2017-04-04 LAB — BAYER DCA HB A1C WAIVED: HB A1C (BAYER DCA - WAIVED): 5.3 % (ref <7.0)

## 2017-04-04 MED ORDER — LISINOPRIL-HYDROCHLOROTHIAZIDE 10-12.5 MG PO TABS: 1.0000 | ORAL_TABLET | Freq: Every day | ORAL | 3 refills | Status: DC

## 2017-04-04 NOTE — Progress Notes (Signed)
   HPI  Patient presents today here for follow-up of hypertension, also to discuss chest discomfort and blurred vision.  Blurred vision Occurs for a few seconds, patient's very concerned about developing diabetes. He does have dietary indiscretion.  Chest discomfort Left-sided last 5-10 seconds, comes on at random, not exertional. The patient is able to exert himself with moderate to maximal effort without any onset of pain. We performed EKG last visit and the pain has not changed. Patient would like to see cardiologist for their opinion.  Hypertension Good medication compliance, no changes recently.  PMH: Smoking status noted ROS: Per HPI  Objective: BP 111/68   Pulse 84   Temp 97.9 F (36.6 C) (Oral)   Ht 6' (1.829 m)   Wt 283 lb (128.4 kg)   BMI 38.38 kg/m  Gen: NAD, alert, cooperative with exam HEENT: NCAT CV: RRR, good S1/S2, no murmur, no chest wall tenderness to palpation Resp: CTABL, no wheezes, non-labored Ext: No edema, warm Neuro: Alert and oriented, No gross deficits  Assessment and plan:  # Hypertension Well-controlled Labs are up-to-date No changes, refill  # Chest discomfort Unclear etiology, consider esophageal spasm, anxiety, or other noncardiac etiology is most likely, however patient is very concerned so I will refer him to cardiology for their opinion  # Blurred vision Patient concerned about diabetes, check A1c again, was checked about one year ago and found to be 5.6. Lasts a few moments at a time, he can correct his eyes during the occurence No further workup for now    Orders Placed This Encounter  Procedures  . BMP8+EGFR  . Bayer DCA Hb A1c Waived  . LDL Cholesterol, Direct  . Ambulatory referral to Cardiology    Referral Priority:   Routine    Referral Type:   Consultation    Referral Reason:   Specialty Services Required    Referred to Provider:   Herminio Commons, MD    Requested Specialty:   Cardiology    Number of Visits  Requested:   1    Meds ordered this encounter  Medications  . lisinopril-hydrochlorothiazide (PRINZIDE,ZESTORETIC) 10-12.5 MG tablet    Sig: Take 1 tablet by mouth daily.    Dispense:  90 tablet    Refill:  [place], MD [place] 04/04/2017, 4:46 PM

## 2017-04-05 LAB — BMP8+EGFR
BUN/Creatinine Ratio: 12 (ref 9–20)
BUN: 11 mg/dL (ref 6–20)
CALCIUM: 9.7 mg/dL (ref 8.7–10.2)
CHLORIDE: 99 mmol/L (ref 96–106)
CO2: 26 mmol/L (ref 20–29)
CREATININE: 0.93 mg/dL (ref 0.76–1.27)
GFR calc non Af Amer: 108 mL/min/{1.73_m2} (ref >59)
GFR, EST AFRICAN AMERICAN: 125 mL/min/{1.73_m2} (ref >59)
Glucose: 96 mg/dL (ref 65–99)
Potassium: 3.9 mmol/L (ref 3.5–5.2)
Sodium: 140 mmol/L (ref 134–144)

## 2017-04-05 LAB — LDL CHOLESTEROL, DIRECT: LDL DIRECT: 91 mg/dL (ref 0–99)

## 2017-04-07 ENCOUNTER — Encounter: Payer: Self-pay | Admitting: Cardiology

## 2017-04-27 ENCOUNTER — Emergency Department (HOSPITAL_COMMUNITY): Payer: BLUE CROSS/BLUE SHIELD

## 2017-04-27 ENCOUNTER — Encounter (HOSPITAL_COMMUNITY): Payer: Self-pay | Admitting: *Deleted

## 2017-04-27 DIAGNOSIS — R079 Chest pain, unspecified: Secondary | ICD-10-CM | POA: Insufficient documentation

## 2017-04-27 DIAGNOSIS — Z87891 Personal history of nicotine dependence: Secondary | ICD-10-CM | POA: Diagnosis not present

## 2017-04-27 DIAGNOSIS — Z79899 Other long term (current) drug therapy: Secondary | ICD-10-CM | POA: Insufficient documentation

## 2017-04-27 DIAGNOSIS — I10 Essential (primary) hypertension: Secondary | ICD-10-CM | POA: Insufficient documentation

## 2017-04-27 LAB — CBC
HEMATOCRIT: 42.8 % (ref 39.0–52.0)
HEMOGLOBIN: 14.7 g/dL (ref 13.0–17.0)
MCH: 29.8 pg (ref 26.0–34.0)
MCHC: 34.3 g/dL (ref 30.0–36.0)
MCV: 86.8 fL (ref 78.0–100.0)
Platelets: 210 10*3/uL (ref 150–400)
RBC: 4.93 MIL/uL (ref 4.22–5.81)
RDW: 12.6 % (ref 11.5–15.5)
WBC: 6.3 10*3/uL (ref 4.0–10.5)

## 2017-04-27 IMAGING — CR DG CHEST 2V
2 series · 2 of 2 positions shown · non-contrast
Comparison: None.

CLINICAL DATA: Transient chest pain x3 days.

EXAM:
CHEST  2 VIEW

[chest pa]
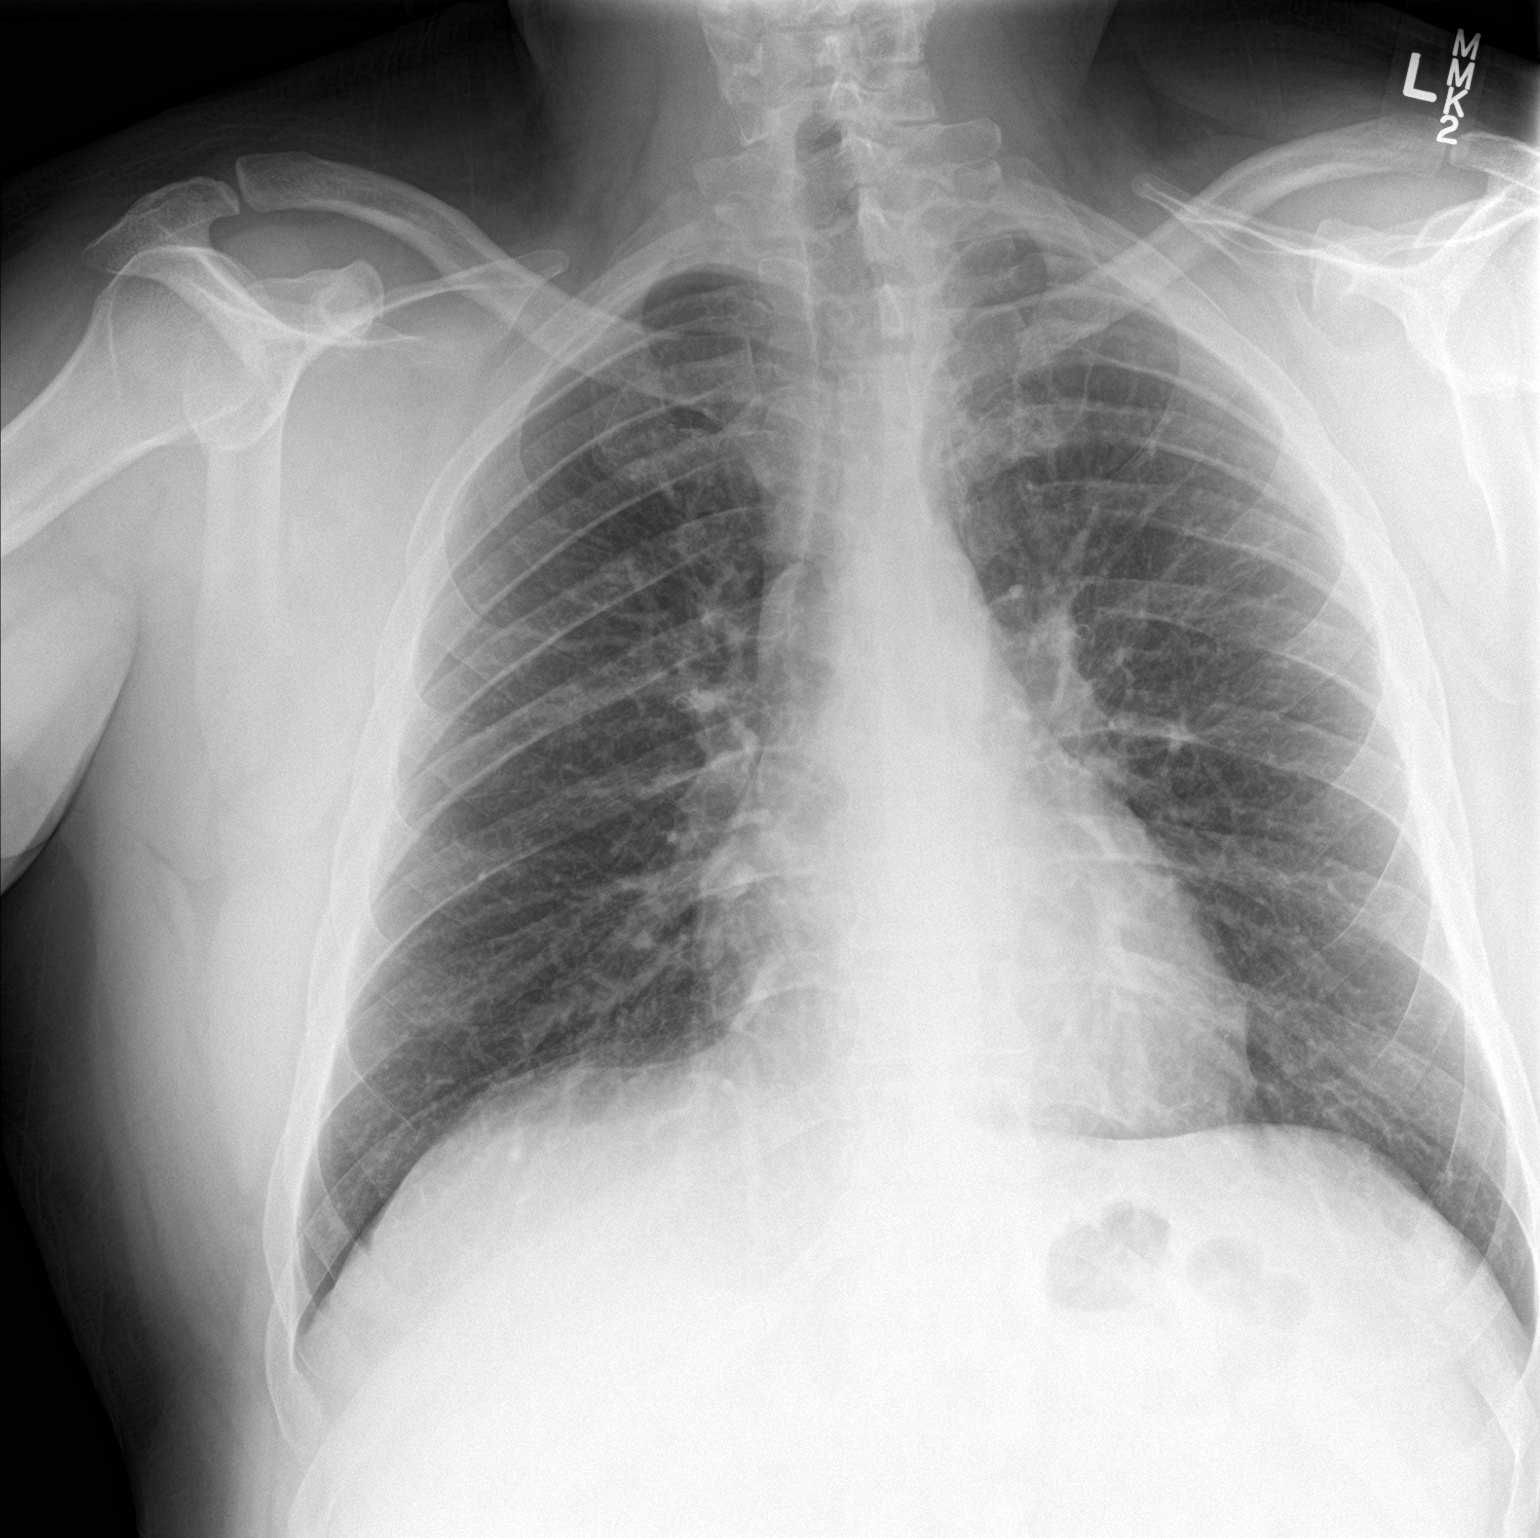

[chest lat]
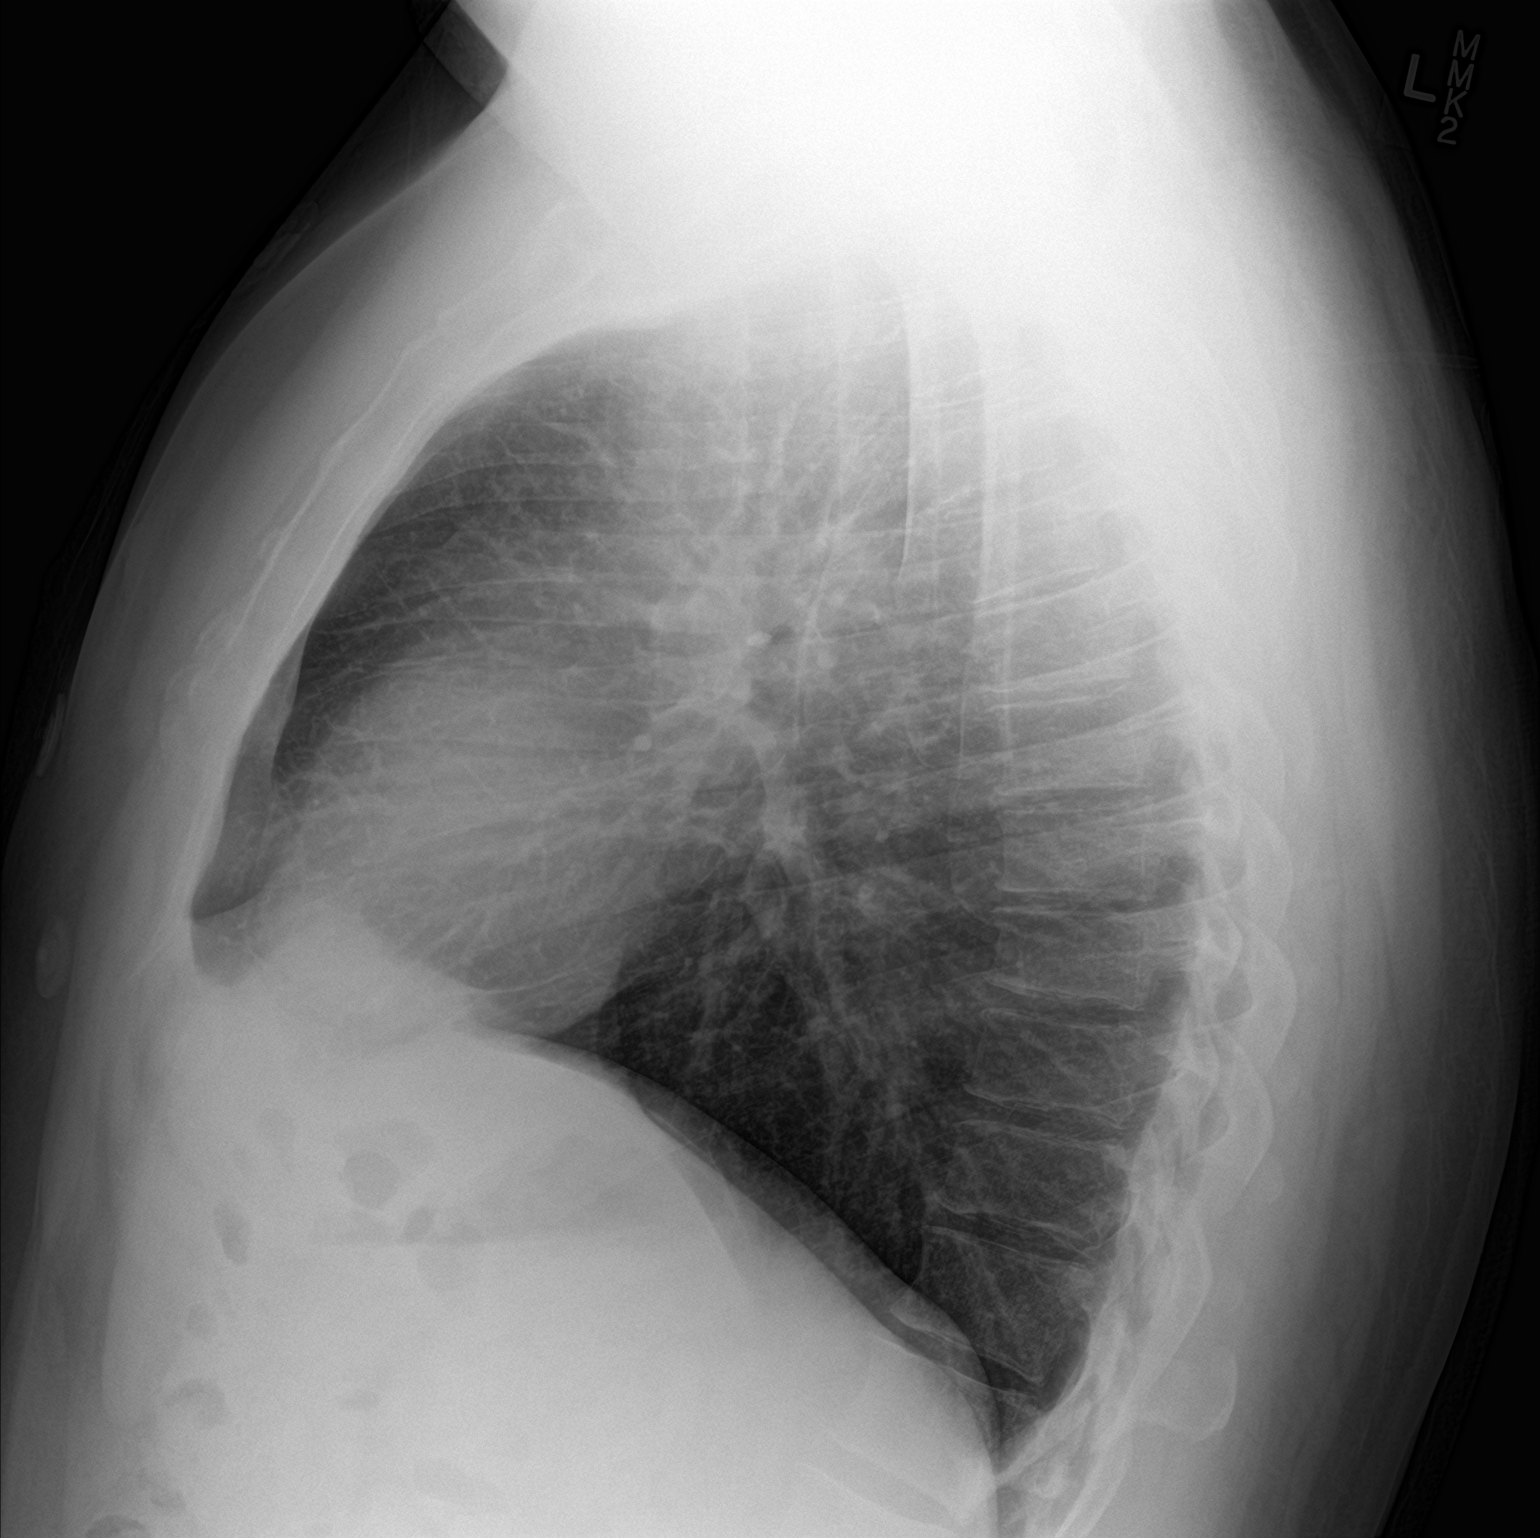

[2 of 2 positions shown; findings below may reference images not displayed]

FINDINGS: The heart size and mediastinal contours are within normal limits. No
pneumonic consolidation, effusion or pneumothorax. Mild bilateral
interstitial prominence may reflect chronic bronchitic change. The
visualized skeletal structures are unremarkable.
IMPRESSION: No active cardiopulmonary disease. Increased interstitial prominence
bilaterally may reflect mild bronchitic change.

## 2017-04-27 NOTE — ED Triage Notes (Signed)
The pt is c/o some transient chest pain for approx 3 days he woke up suddenly last night with a one second  Sharp pain.  He is convinced he has had a silent heart attack after reading on the computer.  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  He is on  Suboxone.  He also has a sl swollen lt ankle

## 2017-04-28 ENCOUNTER — Emergency Department (HOSPITAL_COMMUNITY)
Admission: EM | Admit: 2017-04-28 | Discharge: 2017-04-28 | Disposition: A | Discharge status: Home / Self Care | Payer: BLUE CROSS/BLUE SHIELD | Attending: Emergency Medicine | Admitting: Emergency Medicine

## 2017-04-28 DIAGNOSIS — R079 Chest pain, unspecified: Secondary | ICD-10-CM

## 2017-04-28 LAB — BASIC METABOLIC PANEL
Anion gap: 10 (ref 5–15)
BUN: 13 mg/dL (ref 6–20)
CHLORIDE: 100 mmol/L — AB (ref 101–111)
CO2: 24 mmol/L (ref 22–32)
Calcium: 9.3 mg/dL (ref 8.9–10.3)
Creatinine, Ser: 0.9 mg/dL (ref 0.61–1.24)
Glucose, Bld: 131 mg/dL — ABNORMAL HIGH (ref 65–99)
Potassium: 3.4 mmol/L — ABNORMAL LOW (ref 3.5–5.1)
SODIUM: 134 mmol/L — AB (ref 135–145)

## 2017-04-28 LAB — TROPONIN I

## 2017-04-28 NOTE — ED Provider Notes (Signed)
[facility]-EMERGENCY DEPT Provider Note   CSN: [id number] Arrival date & time: 04/27/17  2216     History   Chief Complaint Chief Complaint  Patient presents with  . Chest Pain    HPI Darrell Henry is a 31 y.o. male.  The history is provided by the patient and medical records.  Chest Pain      31 y.o. M with hx of HTN and substance abuse, presenting to the ED for chest pain.  Patient states he has had an abnormal Sensation in the left side of his chest for about a year now. States is not exactly "pain" but just a "feeling". States this is usually left-sided or midsternal region. Seems to come on at random but is more prominent during the day. States it will last a few seconds at a time and then resolves. He has not noticed any shortness of breath with these episodes. No dizziness or lightheadedness. States he last had this pain about 2 days ago-- lasted 2 seconds at that time but he "shot out of bed" because it scared him. States he's had this pain evaluated in the past with his primary care doctor, no acute findings. States he is concerned that he may actually be experiencing "chest pain" but is not feeling it because he is on Suboxone. Has history of IV drug use but has been clean for several years now. He denies any fever or chills. States he has been "sweaty" lately but this is not necessarily associated with his pain.  He does have history of hypertension and is on medications for this. No family cardiac history.  He is a former smoker.  States he has cardiology follow-up scheduled in 12 days for further evaluation of his ongoing chest pain.  Patient also has complaint of some left ankle edema. This is been ongoing for about 6 months or so. He has seen vascular about this before and told he had a "busted vein". He has had venous duplex performed which was negative. He denies any new injuries or trauma to the ankle. Remains ambulatory with steady gait. States he is concerned about this because  he knows it can be a sign of "heart failure".  Past Medical History:  Diagnosis Date  . Hypertension   . 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    Patient Active Problem List   Diagnosis Date Noted  . Venous stasis 05/24/2016  . Morbid obesity (HCC) 04/12/2016  . Healthcare maintenance 08/06/2015  . HTN (hypertension) 08/06/2015    History reviewed. No pertinent surgical history.     Home Medications    Prior to Admission medications   Medication Sig Start Date End Date Taking? Authorizing Provider  lisinopril-hydrochlorothiazide (PRINZIDE,ZESTORETIC) 10-12.5 MG tablet Take 1 tablet by mouth daily. 04/04/17   Elenora Gamma, MD  Multiple Vitamin (MULTIVITAMIN) capsule Take 1 capsule by mouth daily.    [provider]  SUBOXONE 2-0.5 MG FILM DISSOLVE ONE AND A HALF FILMS UNDER THE TONGUE EVERY DAY 03/16/16   [provider]    Family History Family History  Problem Relation Age of Onset  . Diabetes Mother   . Hypertension Mother   . Diabetes Father   . Hypertension Father     Social History Social History  Substance Use Topics  . Smoking status: Former Smoker    Years: 15.00    Types: Cigarettes  . Smokeless  tobacco: Never Used     Comment: E CIG vape   . Alcohol use No     Comment: DWI age 1; no drinking since DWI.     Allergies   Patient has no known allergies.   Review of Systems Review of Systems  Cardiovascular: Positive for chest pain.  All other systems reviewed and are negative.    Physical Exam Updated Vital Signs BP (!) 117/58 (BP Location: Right Arm)   Pulse 89   Temp 98.9 F (37.2 C) (Oral)   Resp 20   Ht  (1.854 m)   Wt 127 kg (280 lb)   SpO2 95%   BMI 36.94 kg/m   Physical Exam  Constitutional: He is oriented to person, place, and time. He appears well-developed and well-nourished.  HENT:  Head:  Normocephalic and atraumatic.  Mouth/Throat: Oropharynx is clear and moist.  Eyes: Pupils are equal, round, and reactive to light. Conjunctivae and EOM are normal.  Neck: Normal range of motion.  Cardiovascular: Normal rate, regular rhythm and normal heart sounds.   No rub or murmur noted  Pulmonary/Chest: Effort normal and breath sounds normal. No respiratory distress. He has no wheezes. He has no rhonchi. He has no rales.  Lungs clear bilaterally  Abdominal: Soft. Bowel sounds are normal. There is no tenderness. There is no rebound.  Musculoskeletal: Normal range of motion.  Pitting edema bilateral ankles, left slightly worse than right; left lower leg with some changes consistent with venous stasis, there is no overlying warmth to touch or induration suggestive of cellulitis, no calf tenderness, DP pulses intact bilaterally  Neurological: He is alert and oriented to person, place, and time.  Skin: Skin is warm and dry.  Psychiatric: His mood appears anxious.  Seems somewhat anxious on exam  Nursing note and vitals reviewed.    ED Treatments / Results  Labs (all labs ordered are listed, but only abnormal results are displayed) Labs Reviewed  BASIC METABOLIC PANEL - Abnormal; Notable for the following:       Result Value   Sodium 134 (*)    Potassium 3.4 (*)    Chloride 100 (*)    Glucose, Bld 131 (*)    All other components within normal limits  CBC  TROPONIN I    EKG  EKG Interpretation None       Radiology Dg Chest 2 View  Result Date: 04/27/2017 CLINICAL DATA:  Transient chest pain x3 days. EXAM: CHEST  2 VIEW COMPARISON:  None. FINDINGS: The heart size and mediastinal contours are within normal limits. No pneumonic consolidation, effusion or pneumothorax. Mild bilateral interstitial prominence may reflect chronic bronchitic change. The visualized skeletal structures are unremarkable. IMPRESSION: No active cardiopulmonary disease. Increased interstitial prominence  bilaterally may reflect mild bronchitic change. Electronically Signed   By: Tollie Eth M.D.   On: 04/27/2017 23:11    Procedures Procedures (including critical care time)  Medications Ordered in ED Medications - No data to display   Initial Impression / Assessment and Plan / ED Course  I have reviewed the triage vital signs and the nursing notes.  Pertinent labs & imaging results that were available during my care of the patient were reviewed by me and considered in my medical decision making (see chart for details).  31 year old male here with chest pain.  States it is a "feeling" in the left side of his chest and midsternal region. Occurs randomly. Has been happening for over a year now. Has seen his  primary care doctor about this with normal workups in the past. Does not seem to be any acute changes in his symptoms. EKG is nonischemic. Labwork including troponin is negative. Chest x-ray is clear. Patient has history of IV drug use but has been clean for about 5 years now. I do not hear any rub or murmur on exam this suggests endocarditis. No tachycardia or hypoxia suggestive of PE and no significant risk factors for such. Does not appear clinically fluid overloaded, rather edema of the ankles appears to be due to venous stasis. Has seen vascular surgery about this as well. Has had venous duplex that was negative for DVT. At this time given his prolonged symptoms and negative workup here, I have low suspicion for acute cardiac event. I do feel he is stable for discharge home to follow-up with his cardiologist as scheduled in 12 days.  He understands to return here for any new/worsening symptoms.  Patient discharged home in stable condition.  Final Clinical Impressions(s) / ED Diagnoses   Final diagnoses:  Chest pain, unspecified type    New Prescriptions Discharge Medication List as of 04/28/2017  6:53 AM       Garlon Hatchet, PA-C 04/28/17 6045    Geoffery Lyons, MD 05/05/17  2258

## 2017-04-28 NOTE — Discharge Instructions (Signed)
Continue your regular medications. Follow-up with Dr. Darl Householder as scheduled--- can call for earlier appt if any acute changes. Return to the ED for new or worsening symptoms.

## 2017-05-03 ENCOUNTER — Encounter: Payer: Self-pay | Admitting: Cardiovascular Disease

## 2017-05-03 ENCOUNTER — Ambulatory Visit (INDEPENDENT_AMBULATORY_CARE_PROVIDER_SITE_OTHER): Payer: BLUE CROSS/BLUE SHIELD | Admitting: Cardiovascular Disease

## 2017-05-03 VITALS — BP 138/68 | HR 71 | Ht 73.0 in | Wt 285.0 lb

## 2017-05-03 DIAGNOSIS — Z9289 Personal history of other medical treatment: Secondary | ICD-10-CM | POA: Diagnosis not present

## 2017-05-03 DIAGNOSIS — E876 Hypokalemia: Secondary | ICD-10-CM

## 2017-05-03 DIAGNOSIS — R079 Chest pain, unspecified: Secondary | ICD-10-CM | POA: Diagnosis not present

## 2017-05-03 DIAGNOSIS — K219 Gastro-esophageal reflux disease without esophagitis: Secondary | ICD-10-CM

## 2017-05-03 DIAGNOSIS — I10 Essential (primary) hypertension: Secondary | ICD-10-CM | POA: Diagnosis not present

## 2017-05-03 MED ORDER — OMEPRAZOLE 20 MG PO CPDR: 20.0000 mg | DELAYED_RELEASE_CAPSULE | Freq: Every day | ORAL | 11 refills | Status: DC

## 2017-05-03 MED ORDER — POTASSIUM CHLORIDE CRYS ER 20 MEQ PO TBCR: 20.0000 meq | EXTENDED_RELEASE_TABLET | Freq: Every day | ORAL | 11 refills | Status: DC

## 2017-05-03 NOTE — Patient Instructions (Signed)
Medication Instructions:   Begin Omeprazole  daily.  Begin Potassium daily.  Continue all other medications.    Labwork: none  Testing/Procedures: none  Follow-Up: As needed   Any Other Special Instructions Will Be Listed Below (If Applicable).  If you need a refill on your cardiac medications before your next appointment, please call your pharmacy.

## 2017-05-03 NOTE — Progress Notes (Signed)
CARDIOLOGY CONSULT NOTE  Patient ID: Geffrey Michaelsen MRN: [id number] DOB/AGE: Apr 23, 1986 31 y.o.  Admit date: (Not on file) Primary Physician: Elenora Gamma, MD Referring Physician: Ermalinda Memos  Reason for Consultation: chest pain  HPI: Karriem Muench is a 31 y.o. male who is being seen today for the evaluation of chest pain at the request of Elenora Gamma, MD.   He has a history of hypertension.  He was evaluated in the ED on 04/28/17 for chest pain.  He had some bilateral ankle swelling deemed secondary to venous stasis.  He has a prior history of IV heroin abuse.  Potassium was mildly low at 3.4. CBC was normal as was troponin.  Chest x-ray showed increased interstitial prominence bilaterally which may reflect mild bronchitic change with no active cardiopulmonary disease.  ECG which I personally interpreted demonstrated sinus rhythm with no ischemic ST segment or T-wave abnormalities.  Chest pain last for about 5-10 seconds and comes on at random and is not related to exertion.  He tells me he has had episodic chest pain for the past year. It is located on the left side of his chest. He tells me it is not actually a pain that more so an annoyance. It is described as dull. It does not occur with exertion and there is no radiation of these sensations. He denies exertional dyspnea.  Upon speaking with him about symptoms for GERD, he said he has occasional heartburn. He then tells me he has had episodes recently where he had severe heartburn when lying down and had to sit up to belch. Symptoms lasted for 2 hours. He denies dysphagia for solids or liquids.  LDL 91 when recently checked.  Upon speaking with him about his diet, he said that he eats very unhealthy. He eats [employer] burritos for breakfast and eats at [employer] for dinner fairly routinely.  No Known Allergies  Current Outpatient Prescriptions  Medication Sig Dispense Refill  .  lisinopril-hydrochlorothiazide (PRINZIDE,ZESTORETIC) 10-12.5 MG tablet Take 1 tablet by mouth daily. 90 tablet 3  . Multiple Vitamin (MULTIVITAMIN) capsule Take 1 capsule by mouth daily.    . SUBOXONE 2-0.5 MG FILM DISSOLVE ONE AND A HALF FILMS UNDER THE TONGUE EVERY DAY  0   No current facility-administered medications for this visit.     Past Medical History:  Diagnosis Date  . Hypertension   . 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    No past surgical history on file.  Social History   Social History  . Marital status: Single    Spouse name: N/A  . Number of children: N/A  . Years of education: N/A   Occupational History  . Not on file.   Social History Main Topics  . Smoking status: Former Smoker    Years: 15.00    Types: Cigarettes, E-cigarettes    Quit date: 05/03/2012  . Smokeless tobacco: Never Used     Comment: E CIG vape   . Alcohol use No     Comment: DWI age 33; no drinking since DWI.  Marland Kitchen Drug use: No     Comment: previous heroine addiction and narcotic addiction; Suboxone since 2013.  Marland Kitchen Sexual activity: Not on file   Other Topics Concern  . Not on file   Social History Narrative   Drugs:  Heroine abuse; suboxone since 2013.  Exercise: none     No family history of premature CAD in 1st degree relatives.  Current Meds  Medication Sig  . lisinopril-hydrochlorothiazide (PRINZIDE,ZESTORETIC) 10-12.5 MG tablet Take 1 tablet by mouth daily.  . Multiple Vitamin (MULTIVITAMIN) capsule Take 1 capsule by mouth daily.  . SUBOXONE 2-0.5 MG FILM DISSOLVE ONE AND A HALF FILMS UNDER THE TONGUE EVERY DAY      Review of systems complete and found to be negative unless listed above in HPI    Physical exam Blood pressure 138/68, pulse 71, height  (1.854 m), weight 285 lb (129.3 kg), SpO2 96 %. General: NAD Neck: No JVD, no thyromegaly or thyroid nodule.   Lungs: Clear to auscultation bilaterally with normal respiratory effort. CV: Nondisplaced PMI. Regular rate and rhythm, normal S1/S2, no S3/S4, no murmur.  No peripheral edema.  No carotid bruit.    Abdomen: Soft, nontender, no distention.  Skin: Intact without lesions or rashes.  Neurologic: Alert and oriented x 3.  Psych: Normal affect. Extremities: No clubbing or cyanosis.  HEENT: Normal.   ECG: Most recent ECG reviewed.   Labs: Lab Results  Component Value Date/Time   K 3.4 (L) 04/27/2017 10:28 PM   BUN 13 04/27/2017 10:28 PM   BUN 11 04/04/2017 04:49 PM   CREATININE 0.90 04/27/2017 10:28 PM   ALT 27 09/26/2016 11:14 AM   TSH 3.340 09/26/2016 11:14 AM   HGB 14.7 04/27/2017 10:28 PM   HGB 14.9 04/12/2016 09:43 AM     Lipids: Lab Results  Component Value Date/Time   LDLCALC 67 04/12/2016 09:43 AM   LDLDIRECT 91 04/04/2017 04:49 PM   CHOL 157 04/12/2016 09:43 AM   TRIG 286 (H) 04/12/2016 09:43 AM   HDL 33 (L) 04/12/2016 09:43 AM        ASSESSMENT AND PLAN:  1. Chest pain: This does not appear to be cardiac in etiology. He does have symptoms consistent with GERD and eats a diet rich in saturated fats in spite of his normal direct LDL. I counseled him on a healthier diet. I will try omeprazole 20 mg daily. He is on a diuretic for hypertension and his potassium was mildly low. I will give him some potassium supplementation as there may have been a more recent musculoskeletal component to his symptoms.  2. GERD: He does have symptoms consistent with GERD and eats a diet rich in saturated fats in spite of his normal direct LDL. I counseled him on a healthier diet. I will try omeprazole 20 mg daily.  3. Hypokalemia: He is on a diuretic for hypertension and his potassium was mildly low. I will give him some potassium supplementation (20 meq daily) as there may have been a more recent musculoskeletal component to his symptoms.  4. Hypertension: Controlled. No  changes.     Disposition: Follow up prn.  Signed: Prentice Docker, M.D., F.A.C.C.  05/03/2017, 10:33 AM

## 2017-05-03 NOTE — Addendum Note (Signed)
Addended by: Lesle Chris on: 05/03/2017 11:02 AM   Modules accepted: Orders

## 2017-05-08 DIAGNOSIS — H04123 Dry eye syndrome of bilateral lacrimal glands: Secondary | ICD-10-CM | POA: Diagnosis not present

## 2017-05-08 DIAGNOSIS — H40033 Anatomical narrow angle, bilateral: Secondary | ICD-10-CM | POA: Diagnosis not present

## 2017-05-10 ENCOUNTER — Ambulatory Visit: Payer: BLUE CROSS/BLUE SHIELD | Admitting: Cardiovascular Disease

## 2017-06-28 DIAGNOSIS — Z23 Encounter for immunization: Secondary | ICD-10-CM | POA: Diagnosis not present

## 2018-02-20 ENCOUNTER — Encounter: Payer: Self-pay | Admitting: Family Medicine

## 2018-02-20 ENCOUNTER — Ambulatory Visit: Payer: BLUE CROSS/BLUE SHIELD | Admitting: Family Medicine

## 2018-02-20 VITALS — BP 120/72 | HR 76 | Temp 97.1°F | Ht 73.0 in | Wt 299.8 lb

## 2018-02-20 DIAGNOSIS — R200 Anesthesia of skin: Secondary | ICD-10-CM

## 2018-02-20 DIAGNOSIS — R5383 Other fatigue: Secondary | ICD-10-CM | POA: Diagnosis not present

## 2018-02-20 DIAGNOSIS — R0789 Other chest pain: Secondary | ICD-10-CM

## 2018-02-20 LAB — BAYER DCA HB A1C WAIVED: HB A1C: 5.3 % (ref <7.0)

## 2018-02-20 NOTE — Progress Notes (Signed)
   HPI  Patient presents today here to be checked for diabetes, with numbness in the fingers, and chest pain.  Patient states that he had an episode of fatigue a few days ago after eating and is concerned that he has diabetes. He is never had a problem with blood sugars previously.  Patient complains of numbness in the fingers mostly in the morning.  He states that at times it 2 fingers of the time, sometimes 4 fingers of the time.  It is typically in his second and third fingers.  Chest discomfort Continued since last visit, patient only used Prilosec for couple of days and stated that he felt funny he stopped the medication. He denies any exertional symptoms. Describes left-sided chest discomfort that is worse in the morning  PMH: Smoking status noted ROS: Per HPI  Objective: BP 120/72   Pulse 76   Temp (!) 97.1 F (36.2 C) (Oral)   Ht '6\' 1"'$  (1.854 m)   Wt 299 lb 12.8 oz (136 kg)   BMI 39.55 kg/m  Gen: NAD, alert, cooperative with exam HEENT: NCAT Chest wall- No TTP of the chest CV: RRR, good S1/S2, no murmur Resp: CTABL, no wheezes, non-labored Ext: No edema, warm Neuro: Alert and oriented, No gross deficits  Assessment and plan:  #Chest pain Stable Mostly in the morning, no exertional symptoms.  Does not sound cardiac in etiology, has seen cardiology Did not continue PPIs Continues to have symptoms, considering that it is typically in the mornings could be positional  #Fatigue Testing for diabetes today New onset  #Finger numbness Consistent with carpal tunnel syndrome  cock-up splint Negative Tinel's and Phalen's test    Orders Placed This Encounter  Procedures  . Bayer DCA Hb A1c Waived  . CMP14+EGFR  . CBC with [place], MD [facility] 02/20/2018, 3:06 PM

## 2018-02-20 NOTE — Patient Instructions (Signed)
Great to see you!   

## 2018-02-21 LAB — CMP14+EGFR
A/G RATIO: 1.9 (ref 1.2–2.2)
ALBUMIN: 4.3 g/dL (ref 3.5–5.5)
ALT: 32 IU/L (ref 0–44)
AST: 29 IU/L (ref 0–40)
Alkaline Phosphatase: 34 IU/L — ABNORMAL LOW (ref 39–117)
BUN / CREAT RATIO: 15 (ref 9–20)
BUN: 14 mg/dL (ref 6–20)
Bilirubin Total: 0.3 mg/dL (ref 0.0–1.2)
CO2: 28 mmol/L (ref 20–29)
Calcium: 9.2 mg/dL (ref 8.7–10.2)
Chloride: 100 mmol/L (ref 96–106)
Creatinine, Ser: 0.91 mg/dL (ref 0.76–1.27)
GFR calc non Af Amer: 111 mL/min/{1.73_m2} (ref >59)
GFR, EST AFRICAN AMERICAN: 128 mL/min/{1.73_m2} (ref >59)
GLOBULIN, TOTAL: 2.3 g/dL (ref 1.5–4.5)
Glucose: 89 mg/dL (ref 65–99)
POTASSIUM: 4.6 mmol/L (ref 3.5–5.2)
SODIUM: 139 mmol/L (ref 134–144)
Total Protein: 6.6 g/dL (ref 6.0–8.5)

## 2018-02-21 LAB — CBC WITH DIFFERENTIAL/PLATELET
BASOS: 1 %
Basophils Absolute: 0 10*3/uL (ref 0.0–0.2)
EOS (ABSOLUTE): 0.1 10*3/uL (ref 0.0–0.4)
Eos: 2 %
HEMATOCRIT: 41.1 % (ref 37.5–51.0)
Hemoglobin: 14.3 g/dL (ref 13.0–17.7)
Immature Grans (Abs): 0 10*3/uL (ref 0.0–0.1)
Immature Granulocytes: 0 %
LYMPHS ABS: 2.6 10*3/uL (ref 0.7–3.1)
Lymphs: 37 %
MCH: 30 pg (ref 26.6–33.0)
MCHC: 34.8 g/dL (ref 31.5–35.7)
MCV: 86 fL (ref 79–97)
MONOS ABS: 0.6 10*3/uL (ref 0.1–0.9)
Monocytes: 9 %
NEUTROS ABS: 3.6 10*3/uL (ref 1.4–7.0)
NEUTROS PCT: 51 %
PLATELETS: 219 10*3/uL (ref 150–450)
RBC: 4.76 x10E6/uL (ref 4.14–5.80)
RDW: 13.2 % (ref 12.3–15.4)
WBC: 7 10*3/uL (ref 3.4–10.8)

## 2018-03-26 ENCOUNTER — Encounter: Payer: Self-pay | Admitting: Family Medicine

## 2018-03-26 ENCOUNTER — Ambulatory Visit: Payer: BLUE CROSS/BLUE SHIELD | Admitting: Family Medicine

## 2018-03-26 VITALS — BP 135/63 | HR 94 | Temp 97.5°F | Ht 73.0 in | Wt 294.6 lb

## 2018-03-26 DIAGNOSIS — I10 Essential (primary) hypertension: Secondary | ICD-10-CM | POA: Diagnosis not present

## 2018-03-26 MED ORDER — LISINOPRIL-HYDROCHLOROTHIAZIDE 10-12.5 MG PO TABS: 1.0000 | ORAL_TABLET | Freq: Every day | ORAL | 3 refills | Status: DC

## 2018-03-26 NOTE — Progress Notes (Signed)
BP 135/63   Pulse 94   Temp (!) 97.5 F (36.4 C) (Oral)   Ht _0  (1.854 m)   Wt 294 lb 9.6 oz (133.6 kg)   BMI 38.87 kg/m    Subjective:    Patient ID: Darrell Henry, male    DOB: [birth year], 32 y.o.   MRN: [id number]  HPI: Darrell Henry is a 32 y.o. male presenting on 03/26/2018 for Establish Care (Check up on chronic medical conditons ) and Hypertension   HPI Hypertension Patient is currently on lisinopril-hydrochlorothiazide, and their blood pressure today is 135/63. Patient denies any lightheadedness or dizziness. Patient denies headaches, blurred vision, chest pains, shortness of breath, or weakness. Denies any side effects from medication and is content with current medication.   Relevant past medical, surgical, family and social history reviewed and updated as indicated. Interim medical history since our last visit reviewed. Allergies and medications reviewed and updated.  Review of Systems  Constitutional: Negative for chills and fever.  Eyes: Negative for visual disturbance.  Respiratory: Negative for shortness of breath and wheezing.   Cardiovascular: Negative for chest pain and leg swelling.  Musculoskeletal: Negative for back pain and gait problem.  Skin: Negative for rash.  Neurological: Negative for dizziness, weakness, light-headedness and numbness.  All other systems reviewed and are negative.   Per HPI unless specifically indicated above   Allergies as of 03/26/2018   No Known Allergies     Medication List        Accurate as of 03/26/18  2:03 PM. Always use your most recent med list.          folic acid 662 MCG tablet Commonly known as:  FOLVITE Take 400 mcg by mouth daily.   lisinopril-hydrochlorothiazide 10-12.5 MG tablet Commonly known as:  PRINZIDE,ZESTORETIC Take 1 tablet by mouth daily.   multivitamin capsule Take 1 capsule by mouth daily.   SUBOXONE 2-0.5 MG Film Generic drug:  Buprenorphine HCl-Naloxone HCl DISSOLVE ONE AND A  HALF FILMS UNDER THE TONGUE EVERY DAY   zinc gluconate 50 MG tablet Take 50 mg by mouth daily.          Objective:    BP 135/63   Pulse 94   Temp (!) 97.5 F (36.4 C) (Oral)   Ht _1  (1.854 m)   Wt 294 lb 9.6 oz (133.6 kg)   BMI 38.87 kg/m   Wt Readings from Last 3 Encounters:  03/26/18 294 lb 9.6 oz (133.6 kg)  02/20/18 299 lb 12.8 oz (136 kg)  05/03/17 285 lb (129.3 kg)    Physical Exam  Constitutional: He is oriented to person, place, and time. He appears well-developed and well-nourished. No distress.  Eyes: Conjunctivae are normal. No scleral icterus.  Neck: Neck supple. No thyromegaly present.  Cardiovascular: Normal rate, regular rhythm, normal heart sounds and intact distal pulses.  No murmur heard. Pulmonary/Chest: Effort normal and breath sounds normal. No respiratory distress. He has no wheezes.  Musculoskeletal: Normal range of motion. He exhibits no edema.  Lymphadenopathy:    He has no cervical adenopathy.  Neurological: He is alert and oriented to person, place, and time. Coordination normal.  Skin: Skin is warm and dry. No rash noted. He is not diaphoretic.  Psychiatric: He has a normal mood and affect. His behavior is normal.  Nursing note and vitals reviewed.   Results for orders placed or performed in visit on 02/20/18  Bayer DCA Hb A1c Waived  Result Value Ref Range  HB A1C (BAYER DCA - WAIVED) 5.3 <7.0 %  CMP14+EGFR  Result Value Ref Range   Glucose 89 65 - 99 mg/dL   BUN 14 6 - 20 mg/dL   Creatinine, Ser 0.91 0.76 - 1.27 mg/dL   GFR calc non Af Amer 111 >59 mL/min/1.73   GFR calc Af Amer 128 >59 mL/min/1.73   BUN/Creatinine Ratio 15 9 - 20   Sodium 139 134 - 144 mmol/L   Potassium 4.6 3.5 - 5.2 mmol/L   Chloride 100 96 - 106 mmol/L   CO2 28 20 - 29 mmol/L   Calcium 9.2 8.7 - 10.2 mg/dL   Total Protein 6.6 6.0 - 8.5 g/dL   Albumin 4.3 3.5 - 5.5 g/dL   Globulin, Total 2.3 1.5 - 4.5 g/dL   Albumin/Globulin Ratio 1.9 1.2 - 2.2    Bilirubin Total 0.3 0.0 - 1.2 mg/dL   Alkaline Phosphatase 34 (L) 39 - 117 IU/L   AST 29 0 - 40 IU/L   ALT 32 0 - 44 IU/L  CBC with Differential/Platelet  Result Value Ref Range   WBC 7.0 3.4 - 10.8 x10E3/uL   RBC 4.76 4.14 - 5.80 x10E6/uL   Hemoglobin 14.3 13.0 - 17.7 g/dL   Hematocrit 41.1 37.5 - 51.0 %   MCV 86 79 - 97 fL   MCH 30.0 26.6 - 33.0 pg   MCHC 34.8 31.5 - 35.7 g/dL   RDW 13.2 12.3 - 15.4 %   Platelets 219 150 - 450 x10E3/uL   Neutrophils 51 Not Estab. %   Lymphs 37 Not Estab. %   Monocytes 9 Not Estab. %   Eos 2 Not Estab. %   Basos 1 Not Estab. %   Neutrophils Absolute 3.6 1.4 - 7.0 x10E3/uL   Lymphocytes Absolute 2.6 0.7 - 3.1 x10E3/uL   Monocytes Absolute 0.6 0.1 - 0.9 x10E3/uL   EOS (ABSOLUTE) 0.1 0.0 - 0.4 x10E3/uL   Basophils Absolute 0.0 0.0 - 0.2 x10E3/uL   Immature Granulocytes 0 Not Estab. %   Immature Grans (Abs) 0.0 0.0 - 0.1 x10E3/uL      Assessment & Plan:   Problem List Items Addressed This Visit      Cardiovascular and Mediastinum   HTN (hypertension) - Primary   Relevant Medications   lisinopril-hydrochlorothiazide (PRINZIDE,ZESTORETIC) 10-12.5 MG tablet     Other   Morbid obesity (HCC)     Continue lisinopril hydrochlorothiazide  Follow up plan: Return in about 6 months (around 09/26/2018), or if symptoms worsen or fail to improve, for Hypertension recheck.  Counseling provided for all of the vaccine components No orders of the defined types were placed in this encounter.   Caryl Pina, MD [facility] 03/26/2018, 2:03 PM

## 2018-07-05 DIAGNOSIS — Z23 Encounter for immunization: Secondary | ICD-10-CM | POA: Diagnosis not present

## 2018-07-28 DIAGNOSIS — R509 Fever, unspecified: Secondary | ICD-10-CM | POA: Diagnosis not present

## 2018-07-28 DIAGNOSIS — J039 Acute tonsillitis, unspecified: Secondary | ICD-10-CM | POA: Diagnosis not present

## 2018-07-28 DIAGNOSIS — Z6841 Body Mass Index (BMI) 40.0 and over, adult: Secondary | ICD-10-CM | POA: Diagnosis not present

## 2018-07-31 ENCOUNTER — Ambulatory Visit: Payer: BLUE CROSS/BLUE SHIELD | Admitting: Family Medicine

## 2018-07-31 ENCOUNTER — Encounter: Payer: Self-pay | Admitting: Family Medicine

## 2018-07-31 VITALS — BP 118/69 | HR 62 | Temp 98.5°F | Ht 73.0 in | Wt 286.0 lb

## 2018-07-31 DIAGNOSIS — R1084 Generalized abdominal pain: Secondary | ICD-10-CM

## 2018-07-31 DIAGNOSIS — J029 Acute pharyngitis, unspecified: Secondary | ICD-10-CM | POA: Diagnosis not present

## 2018-07-31 LAB — CULTURE, GROUP A STREP

## 2018-07-31 LAB — RAPID STREP SCREEN (MED CTR MEBANE ONLY): Strep Gp A Ag, IA W/Reflex: NEGATIVE

## 2018-07-31 NOTE — Patient Instructions (Signed)

## 2018-07-31 NOTE — Progress Notes (Signed)
Subjective:    Patient ID: Darrell Henry, male    DOB: [birth year], 32 y.o.   MRN: [id number]  Chief Complaint:  Sore Throat (seen at [facility] urgent care on 07/28/18 tested for strep (negative), treated with Clindamycin for tonsillitis, not any better, still taking antibiotic)   HPI: Darrell Henry is a 32 y.o. male presenting on 07/31/2018 for Sore Throat (seen at [facility] urgent care on 07/28/18 tested for strep (negative), treated with Clindamycin for tonsillitis, not any better, still taking antibiotic)  Pt presents today with complaints of continued sore throat, fatigue, chills, and generalized abdominal pain. Pt states he was seen at [facility] on 07/28/18. States he was swabbed for the flu and strep and both were negative. Pt states they still placed him on clindamycin. States he has been taking that for 2.5 days and is not feeling better. States last night he vomited. States he has been using the lidocaine for his throat without relief of pain. He has not taken any tylenol.   Relevant past medical, surgical, family, and social history reviewed and updated as indicated.  Allergies and medications reviewed and updated.   Past Medical History:  Diagnosis Date  . Hypertension   . Substance abuse in remission ([place]) 08/16/2011   heroine addiction; last use 2013; history of narcotic abuse; history of DWI; last alcohol use 2012.    History reviewed. No pertinent surgical history.  Social History   Socioeconomic History  . Marital status: Single    Spouse name: Not on file  . Number of children: Not on file  . Years of education: Not on file  . Highest education level: Not on file  Occupational History  . Not on file  Social Needs  . Financial resource strain: Not on file  . Food insecurity:    Worry: Not on file    Inability: Not on file  . Transportation needs:    Medical: Not on file    Non-medical: Not on file  Tobacco Use  . Smoking status: Former Smoker    Years: 15.00    Types:  Cigarettes, E-cigarettes    Last attempt to quit: 05/03/2012    Years since quitting: 6.2  . Smokeless tobacco: Never Used  . Tobacco comment: E CIG vape   Substance and Sexual Activity  . Alcohol use: No    Comment: DWI age 42; no drinking since DWI.  Marland Kitchen Drug use: No    Types: Heroin    Comment: previous heroine addiction and narcotic addiction; Suboxone since 2013.  Marland Kitchen Sexual activity: Not on file  Lifestyle  . Physical activity:    Days per week: Not on file    Minutes per session: Not on file  . Stress: Not on file  Relationships  . Social connections:    Talks on phone: Not on file    Gets together: Not on file    Attends religious service: Not on file    Active member of club or organization: Not on file    Attends meetings of clubs or organizations: Not on file    Relationship status: Not on file  . Intimate partner violence:    Fear of current or ex partner: Not on file    Emotionally abused: Not on file    Physically abused: Not on file    Forced sexual activity: Not on file  Other Topics Concern  . Not on file  Social History Narrative   Drugs:  Heroine abuse; suboxone since  2013.     Exercise: none    Outpatient Encounter Medications as of 07/31/2018  Medication Sig  . clindamycin (CLEOCIN) 300 MG capsule Take 300 mg by mouth 3 (three) times daily.  . folic acid (FOLVITE) 655 MCG tablet Take 400 mcg by mouth daily.  Marland Kitchen lidocaine (XYLOCAINE) 2 % solution Use as directed 15 mLs in the mouth or throat every 3 (three) hours.  Marland Kitchen lisinopril-hydrochlorothiazide (PRINZIDE,ZESTORETIC) 10-12.5 MG tablet Take 1 tablet by mouth daily.  . Multiple Vitamin (MULTIVITAMIN) capsule Take 1 capsule by mouth daily.  . SUBOXONE 2-0.5 MG FILM DISSOLVE ONE AND A HALF FILMS UNDER THE TONGUE EVERY DAY  . zinc gluconate 50 MG tablet Take 50 mg by mouth daily.   No facility-administered encounter medications on file as of 07/31/2018.     No Known Allergies  Review of Systems   Constitutional: Positive for chills, fatigue and fever.  HENT: Positive for sore throat and trouble swallowing.   Respiratory: Negative for cough, chest tightness and shortness of breath.   Cardiovascular: Negative for chest pain, palpitations and leg swelling.  Gastrointestinal: Positive for abdominal pain, nausea and vomiting (x 1).  Musculoskeletal: Negative for arthralgias and myalgias.  Skin: Negative for rash.  Neurological: Negative for dizziness, weakness and headaches.  Psychiatric/Behavioral: Negative for confusion.  All other systems reviewed and are negative.       Objective:    BP 118/69   Pulse 62   Temp 98.5 F (36.9 C) (Oral)   Ht _0  (1.854 m)   Wt 286 lb (129.7 kg)   BMI 37.73 kg/m    Wt Readings from Last 3 Encounters:  07/31/18 286 lb (129.7 kg)  03/26/18 294 lb 9.6 oz (133.6 kg)  02/20/18 299 lb 12.8 oz (136 kg)    Physical Exam Vitals signs and nursing note reviewed.  Constitutional:      General: He is in acute distress (mild).     Appearance: He is well-developed.  HENT:     Head: Normocephalic and atraumatic.     Right Ear: Tympanic membrane and ear canal normal.     Left Ear: Tympanic membrane and ear canal normal.     Nose: Congestion present. No rhinorrhea.     Mouth/Throat:     Mouth: Mucous membranes are moist.     Pharynx: Uvula midline. Pharyngeal swelling, oropharyngeal exudate and posterior oropharyngeal erythema present.     Tonsils: Tonsillar exudate present. No tonsillar abscesses. Swelling: 3+ on the right. 3+ on the left.  Eyes:     Conjunctiva/sclera: Conjunctivae normal.     Pupils: Pupils are equal, round, and reactive to light.  Neck:     Musculoskeletal: Normal range of motion and neck supple.  Cardiovascular:     Rate and Rhythm: Normal rate and regular rhythm.     Heart sounds: No murmur. No friction rub. No gallop.   Pulmonary:     Effort: Pulmonary effort is normal.     Breath sounds: Normal breath sounds.   Abdominal:     General: Abdomen is protuberant. Bowel sounds are normal. There is no distension.     Palpations: Abdomen is soft. There is no hepatomegaly or splenomegaly.     Tenderness: There is no abdominal tenderness.  Lymphadenopathy:     Head:     Right side of head: Tonsillar adenopathy present.     Left side of head: Tonsillar adenopathy present.     Cervical: Cervical adenopathy present.  Skin:  General: Skin is warm and dry.     Capillary Refill: Capillary refill takes less than 2 seconds.  Neurological:     General: No focal deficit present.     Mental Status: He is alert and oriented to person, place, and time.  Psychiatric:        Mood and Affect: Mood normal.        Speech: Speech normal.        Behavior: Behavior normal. Behavior is cooperative.     Results for orders placed or performed in visit on 02/20/18  Bayer DCA Hb A1c Waived  Result Value Ref Range   HB A1C (BAYER DCA - WAIVED) 5.3 <7.0 %  CMP14+EGFR  Result Value Ref Range   Glucose 89 65 - 99 mg/dL   BUN 14 6 - 20 mg/dL   Creatinine, Ser 0.91 0.76 - 1.27 mg/dL   GFR calc non Af Amer 111 >59 mL/min/1.73   GFR calc Af Amer 128 >59 mL/min/1.73   BUN/Creatinine Ratio 15 9 - 20   Sodium 139 134 - 144 mmol/L   Potassium 4.6 3.5 - 5.2 mmol/L   Chloride 100 96 - 106 mmol/L   CO2 28 20 - 29 mmol/L   Calcium 9.2 8.7 - 10.2 mg/dL   Total Protein 6.6 6.0 - 8.5 g/dL   Albumin 4.3 3.5 - 5.5 g/dL   Globulin, Total 2.3 1.5 - 4.5 g/dL   Albumin/Globulin Ratio 1.9 1.2 - 2.2   Bilirubin Total 0.3 0.0 - 1.2 mg/dL   Alkaline Phosphatase 34 (L) 39 - 117 IU/L   AST 29 0 - 40 IU/L   ALT 32 0 - 44 IU/L  CBC with Differential/Platelet  Result Value Ref Range   WBC 7.0 3.4 - 10.8 x10E3/uL   RBC 4.76 4.14 - 5.80 x10E6/uL   Hemoglobin 14.3 13.0 - 17.7 g/dL   Hematocrit 41.1 37.5 - 51.0 %   MCV 86 79 - 97 fL   MCH 30.0 26.6 - 33.0 pg   MCHC 34.8 31.5 - 35.7 g/dL   RDW 13.2 12.3 - 15.4 %   Platelets 219 150 -  450 x10E3/uL   Neutrophils 51 Not Estab. %   Lymphs 37 Not Estab. %   Monocytes 9 Not Estab. %   Eos 2 Not Estab. %   Basos 1 Not Estab. %   Neutrophils Absolute 3.6 1.4 - 7.0 x10E3/uL   Lymphocytes Absolute 2.6 0.7 - 3.1 x10E3/uL   Monocytes Absolute 0.6 0.1 - 0.9 x10E3/uL   EOS (ABSOLUTE) 0.1 0.0 - 0.4 x10E3/uL   Basophils Absolute 0.0 0.0 - 0.2 x10E3/uL   Immature Granulocytes 0 Not Estab. %   Immature Grans (Abs) 0.0 0.0 - 0.1 x10E3/uL       Pertinent labs & imaging results that were available during my care of the patient were reviewed by me and considered in my medical decision making.  Assessment & Plan:  Darrell Henry was seen today for sore throat.  Diagnoses and all orders for this visit:  Sore throat Increase fluid intake. Symptomatic care. Rapid strep negative. Chloraseptic spray or lozenges as needed for pain. Tylenol as needed for pain and fever control. Labs pending. Report any new or worsening symptoms.  -     Rapid Strep Screen ([facility] ONLY) -     Epstein-Barr virus VCA antibody panel  Generalized abdominal pain -     Epstein-Barr virus VCA antibody panel    Continue all other maintenance medications.  Follow  up plan: Return in about 4 weeks (around 08/28/2018), or if symptoms worsen or fail to improve.  Educational handout given for pharyngitis   The above assessment and management plan was discussed with the patient. The patient verbalized understanding of and has agreed to the management plan. Patient is aware to call the clinic if symptoms persist or worsen. Patient is aware when to return to the clinic for a follow-up visit. Patient educated on when it is appropriate to go to the emergency department.   Monia Pouch, FNP-C [facility] [phone]

## 2018-08-01 LAB — EPSTEIN-BARR VIRUS VCA ANTIBODY PANEL
EBV Early Antigen Ab, IgG: <9 U/mL (ref 0.0–8.9)
EBV NA IGG: 162 U/mL — AB (ref 0.0–17.9)
EBV VCA IgG: >600 U/mL — ABNORMAL HIGH (ref 0.0–17.9)
EBV VCA IgM: <36 U/mL (ref 0.0–35.9)

## 2018-09-26 ENCOUNTER — Ambulatory Visit: Payer: BLUE CROSS/BLUE SHIELD | Admitting: Family Medicine

## 2018-10-05 DIAGNOSIS — I10 Essential (primary) hypertension: Secondary | ICD-10-CM | POA: Diagnosis not present

## 2018-10-17 ENCOUNTER — Encounter: Payer: Self-pay | Admitting: Family Medicine

## 2018-10-17 ENCOUNTER — Ambulatory Visit (INDEPENDENT_AMBULATORY_CARE_PROVIDER_SITE_OTHER): Payer: BLUE CROSS/BLUE SHIELD | Admitting: Family Medicine

## 2018-10-17 VITALS — BP 136/84 | HR 117 | Temp 96.9°F | Ht 73.0 in | Wt 285.6 lb

## 2018-10-17 DIAGNOSIS — I10 Essential (primary) hypertension: Secondary | ICD-10-CM | POA: Diagnosis not present

## 2018-10-17 NOTE — Progress Notes (Signed)
BP 136/84   Pulse (!) 117   Temp (!) 96.9 F (36.1 C) (Oral)   Ht '6\' 1"'  (1.854 m)   Wt 285 lb 9.6 oz (129.5 kg)   BMI 37.68 kg/m    Subjective:    Patient ID: Darrell Henry, male    DOB: Oct 20, 1985, 33 y.o.   MRN: [id number]  HPI: Darrell Henry is a 33 y.o. male presenting on 10/17/2018 for Hypertension (6 month follow up- states he had mono and wants to make sure it is gone- states he feels fine now)   HPI Hypertension Patient is currently on lisinopril hydrochlorothiazide, and their blood pressure today is 136/84. Patient denies any lightheadedness or dizziness. Patient denies headaches, blurred vision, chest pains, shortness of breath, or weakness. Denies any side effects from medication and is content with current medication.   Obesity and weight loss and discussed again lifestyle changes.  Relevant past medical, surgical, family and social history reviewed and updated as indicated. Interim medical history since our last visit reviewed. Allergies and medications reviewed and updated.  Review of Systems  Constitutional: Negative for chills and fever.  Eyes: Negative for visual disturbance.  Respiratory: Negative for shortness of breath and wheezing.   Cardiovascular: Negative for chest pain and leg swelling.  Musculoskeletal: Negative for back pain and gait problem.  Skin: Negative for rash.  Neurological: Negative for dizziness, weakness, light-headedness and numbness.  All other systems reviewed and are negative.   Per HPI unless specifically indicated above   Allergies as of 10/17/2018   No Known Allergies     Medication List       Accurate as of October 17, 2018  4:06 PM. Always use your most recent med list.        folic acid 169 MCG tablet Commonly known as:  FOLVITE Take 400 mcg by mouth daily.   lisinopril-hydrochlorothiazide 10-12.5 MG tablet Commonly known as:  PRINZIDE,ZESTORETIC Take 1 tablet by mouth daily.   multivitamin capsule Take 1 capsule  by mouth daily.   SUBOXONE 2-0.5 MG Film Generic drug:  Buprenorphine HCl-Naloxone HCl DISSOLVE ONE AND A HALF FILMS UNDER THE TONGUE EVERY DAY   zinc gluconate 50 MG tablet Take 50 mg by mouth daily.          Objective:    BP 136/84   Pulse (!) 117   Temp (!) 96.9 F (36.1 C) (Oral)   Ht '6\' 1"'  (1.854 m)   Wt 285 lb 9.6 oz (129.5 kg)   BMI 37.68 kg/m   Wt Readings from Last 3 Encounters:  10/17/18 285 lb 9.6 oz (129.5 kg)  07/31/18 286 lb (129.7 kg)  03/26/18 294 lb 9.6 oz (133.6 kg)    Physical Exam Vitals signs and nursing note reviewed.  Constitutional:      General: He is not in acute distress.    Appearance: He is well-developed. He is not diaphoretic.  Eyes:     General: No scleral icterus.    Conjunctiva/sclera: Conjunctivae normal.  Neck:     Musculoskeletal: Neck supple.     Thyroid: No thyromegaly.  Cardiovascular:     Rate and Rhythm: Normal rate and regular rhythm.     Heart sounds: Normal heart sounds. No murmur.  Pulmonary:     Effort: Pulmonary effort is normal. No respiratory distress.     Breath sounds: Normal breath sounds. No wheezing.  Musculoskeletal: Normal range of motion.  Lymphadenopathy:     Cervical: No cervical adenopathy.  Skin:  General: Skin is warm and dry.     Findings: No rash.  Neurological:     Mental Status: He is alert and oriented to person, place, and time.     Coordination: Coordination normal.  Psychiatric:        Behavior: Behavior normal.       Assessment & Plan:   Problem List Items Addressed This Visit      Cardiovascular and Mediastinum   HTN (hypertension) - Primary   Relevant Orders   CBC with Differential/Platelet   CMP14+EGFR     Other   Morbid obesity ([place])   Relevant Orders   CBC with Differential/Platelet       Follow up plan: Return in about 6 months (around 04/19/2019), or if symptoms worsen or fail to improve, for Hypertension.  Counseling provided for all of the vaccine  components No orders of the defined types were placed in this encounter.   Caryl Pina, MD [facility] 10/17/2018, 4:06 PM

## 2018-10-18 LAB — CBC WITH DIFFERENTIAL/PLATELET
BASOS: 1 %
Basophils Absolute: 0 10*3/uL (ref 0.0–0.2)
EOS (ABSOLUTE): 0.1 10*3/uL (ref 0.0–0.4)
Eos: 1 %
HEMOGLOBIN: 14.5 g/dL (ref 13.0–17.7)
Hematocrit: 42.1 % (ref 37.5–51.0)
IMMATURE GRANS (ABS): 0 10*3/uL (ref 0.0–0.1)
IMMATURE GRANULOCYTES: 0 %
Lymphocytes Absolute: 2.1 10*3/uL (ref 0.7–3.1)
Lymphs: 29 %
MCH: 30.3 pg (ref 26.6–33.0)
MCHC: 34.4 g/dL (ref 31.5–35.7)
MCV: 88 fL (ref 79–97)
MONOCYTES: 8 %
Monocytes Absolute: 0.6 10*3/uL (ref 0.1–0.9)
NEUTROS ABS: 4.4 10*3/uL (ref 1.4–7.0)
NEUTROS PCT: 61 %
Platelets: 267 10*3/uL (ref 150–450)
RBC: 4.78 x10E6/uL (ref 4.14–5.80)
RDW: 13 % (ref 11.6–15.4)
WBC: 7.1 10*3/uL (ref 3.4–10.8)

## 2018-10-18 LAB — CMP14+EGFR
A/G RATIO: 1.7 (ref 1.2–2.2)
ALBUMIN: 4.3 g/dL (ref 4.0–5.0)
ALT: 33 IU/L (ref 0–44)
AST: 29 IU/L (ref 0–40)
Alkaline Phosphatase: 42 IU/L (ref 39–117)
BUN / CREAT RATIO: 16 (ref 9–20)
BUN: 15 mg/dL (ref 6–20)
Bilirubin Total: 0.3 mg/dL (ref 0.0–1.2)
CALCIUM: 9.6 mg/dL (ref 8.7–10.2)
CO2: 27 mmol/L (ref 20–29)
Chloride: 101 mmol/L (ref 96–106)
Creatinine, Ser: 0.91 mg/dL (ref 0.76–1.27)
GFR, EST AFRICAN AMERICAN: 128 mL/min/{1.73_m2} (ref >59)
GFR, EST NON AFRICAN AMERICAN: 110 mL/min/{1.73_m2} (ref >59)
GLUCOSE: 109 mg/dL — AB (ref 65–99)
Globulin, Total: 2.5 g/dL (ref 1.5–4.5)
Potassium: 3.8 mmol/L (ref 3.5–5.2)
Sodium: 142 mmol/L (ref 134–144)
TOTAL PROTEIN: 6.8 g/dL (ref 6.0–8.5)

## 2018-12-30 ENCOUNTER — Encounter (HOSPITAL_COMMUNITY): Payer: Self-pay | Admitting: Emergency Medicine

## 2018-12-30 ENCOUNTER — Other Ambulatory Visit: Payer: Self-pay

## 2018-12-30 ENCOUNTER — Emergency Department (HOSPITAL_COMMUNITY)
Admission: EM | Admit: 2018-12-30 | Discharge: 2018-12-30 | Disposition: A | Discharge status: Home / Self Care | Payer: BLUE CROSS/BLUE SHIELD | Attending: Emergency Medicine | Admitting: Emergency Medicine

## 2018-12-30 DIAGNOSIS — S30850A Superficial foreign body of lower back and pelvis, initial encounter: Secondary | ICD-10-CM | POA: Insufficient documentation

## 2018-12-30 DIAGNOSIS — Y929 Unspecified place or not applicable: Secondary | ICD-10-CM | POA: Insufficient documentation

## 2018-12-30 DIAGNOSIS — W57XXXA Bitten or stung by nonvenomous insect and other nonvenomous arthropods, initial encounter: Secondary | ICD-10-CM | POA: Insufficient documentation

## 2018-12-30 DIAGNOSIS — Z87891 Personal history of nicotine dependence: Secondary | ICD-10-CM | POA: Diagnosis not present

## 2018-12-30 DIAGNOSIS — Z79899 Other long term (current) drug therapy: Secondary | ICD-10-CM | POA: Insufficient documentation

## 2018-12-30 DIAGNOSIS — I10 Essential (primary) hypertension: Secondary | ICD-10-CM | POA: Insufficient documentation

## 2018-12-30 DIAGNOSIS — Y999 Unspecified external cause status: Secondary | ICD-10-CM | POA: Diagnosis not present

## 2018-12-30 DIAGNOSIS — Y939 Activity, unspecified: Secondary | ICD-10-CM | POA: Insufficient documentation

## 2018-12-30 DIAGNOSIS — S30861A Insect bite (nonvenomous) of abdominal wall, initial encounter: Secondary | ICD-10-CM | POA: Diagnosis not present

## 2018-12-30 MED ORDER — DOXYCYCLINE HYCLATE 100 MG PO CAPS: 100.0000 mg | ORAL_CAPSULE | Freq: Two times a day (BID) | ORAL | 0 refills | Status: DC

## 2018-12-30 MED ORDER — DOXYCYCLINE HYCLATE 100 MG PO TABS
100.0000 mg | ORAL_TABLET | Freq: Once | ORAL | Status: AC
  Administered 2018-12-30: 100 mg via ORAL
  Filled 2018-12-30: qty 1

## 2018-12-30 NOTE — ED Provider Notes (Signed)
[facility] Provider Note   CSN: [id number] Arrival date & time: 12/30/18  [id number]    History   Chief Complaint Chief Complaint  Patient presents with  . Tick Removal    HPI Darrell PettiesJeffrey Ciocca is a 33 y.o. male.     Patient states he had approximately 75 ticks on his body after walking in the woods yesterday.  His mother helped remove them today.  He otherwise has no complaints.  There are multiple areas of erythema and irritation.  Severity is moderate.  No fever chills, rash, headache.     Past Medical History:  Diagnosis Date  . Hypertension   . 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    Patient Active Problem List   Diagnosis Date Noted  . Venous stasis 05/24/2016  . Morbid obesity ([facility]) 04/12/2016  . HTN (hypertension) 08/06/2015    History reviewed. No pertinent surgical history.      Home Medications    Prior to Admission medications   Medication Sig Start Date End Date Taking? Authorizing Provider  doxycycline (VIBRAMYCIN) 100 MG capsule Take 1 capsule (100 mg total) by mouth 2 (two) times daily. 12/30/18   Donnetta Hutchingook, Jaryiah Mehlman, MD  folic acid (FOLVITE) 800 MCG tablet Take 400 mcg by mouth daily.    [provider]  lisinopril-hydrochlorothiazide (PRINZIDE,ZESTORETIC) 10-12.5 MG tablet Take 1 tablet by mouth daily. 03/26/18   Dettinger, Elige RadonJoshua A, MD  Multiple Vitamin (MULTIVITAMIN) capsule Take 1 capsule by mouth daily.    [provider]  SUBOXONE 2-0.5 MG FILM DISSOLVE ONE AND A HALF FILMS UNDER THE TONGUE EVERY DAY 03/16/16   [provider]  zinc gluconate 50 MG tablet Take 50 mg by mouth daily.    [provider]    Family History Family History  Problem Relation Age of Onset  . Diabetes Mother   . Hypertension Mother   . Diabetes Father   . Hypertension Father     Social History Social History    Tobacco Use  . Smoking status: Former Smoker    Years: 15.00    Types: Cigarettes, E-cigarettes    Last attempt to quit: 05/03/2012    Years since quitting: 6.6  . Smokeless tobacco: Never Used  . Tobacco comment: E CIG vape   Substance Use Topics  . Alcohol use: No    Comment: DWI age 33; no drinking since DWI.  Marland Kitchen. Drug use: No    Types: Heroin    Comment: previous heroine addiction and narcotic addiction; Suboxone since 2013.     Allergies   Patient has no known allergies.   Review of Systems Review of Systems  All other systems reviewed and are negative.    Physical Exam Updated Vital Signs BP 115/78 (BP Location: Right Arm)   Pulse 88   Temp 97.9 F (36.6 C) (Oral)   Resp 16   Ht 6' (1.829 m)   Wt 131.5 kg   SpO2 100%   BMI 39.33 kg/m   Physical Exam Vitals signs and nursing note reviewed.  Constitutional:      Appearance: He is well-developed.  HENT:     Head: Normocephalic and atraumatic.  Eyes:     Conjunctiva/sclera: Conjunctivae normal.  Neck:     Musculoskeletal: Neck supple.  Cardiovascular:     Rate and Rhythm: Normal rate and regular rhythm.  Pulmonary:  Effort: Pulmonary effort is normal.     Breath sounds: Normal breath sounds.  Abdominal:     General: Bowel sounds are normal.     Palpations: Abdomen is soft.  Musculoskeletal: Normal range of motion.  Skin:    Comments: Multiple punctate areas of excoriation and erythema.  Tick fragments removed from area of left lower back.  Neurological:     Mental Status: He is alert and oriented to person, place, and time.  Psychiatric:        Behavior: Behavior normal.      ED Treatments / Results  Labs (all labs ordered are listed, but only abnormal results are displayed) Labs Reviewed - No data to display  EKG None  Radiology No results found.  Procedures Procedures (including critical care time)  Medications Ordered in ED Medications  doxycycline (VIBRA-TABS) tablet 100 mg  (100 mg Oral Given 12/30/18 0919)     Initial Impression / Assessment and Plan / ED Course  I have reviewed the triage vital signs and the nursing notes.  Pertinent labs & imaging results that were available during my care of the patient were reviewed by me and considered in my medical decision making (see chart for details).        Patient presents with multiple tick bites.  I did remove some tick appendages in his left lower back.  Rx doxycycline 100 mg  Final Clinical Impressions(s) / ED Diagnoses   Final diagnoses:  Tick bite, initial encounter    ED Discharge Orders         Ordered    doxycycline (VIBRAMYCIN) 100 MG capsule  2 times daily     12/30/18 0911           Donnetta Hutching, MD 12/30/18 [phone]

## 2018-12-30 NOTE — ED Triage Notes (Signed)
Pt states he had about 50-100 seed ticks on him and is concerned about tick borne illnesses. Removed today and no symptoms.

## 2018-12-30 NOTE — Discharge Instructions (Addendum)
Keep very clean using Dial soap.  Prescription for antibiotic.  Return if worse.

## 2019-01-10 DIAGNOSIS — I10 Essential (primary) hypertension: Secondary | ICD-10-CM | POA: Diagnosis not present

## 2019-03-02 ENCOUNTER — Encounter (HOSPITAL_COMMUNITY): Payer: Self-pay | Admitting: Emergency Medicine

## 2019-03-02 ENCOUNTER — Other Ambulatory Visit: Payer: Self-pay

## 2019-03-02 ENCOUNTER — Emergency Department (HOSPITAL_COMMUNITY)
Admission: EM | Admit: 2019-03-02 | Discharge: 2019-03-02 | Disposition: A | Discharge status: Home / Self Care | Payer: BC Managed Care – PPO | Attending: Emergency Medicine | Admitting: Emergency Medicine

## 2019-03-02 DIAGNOSIS — Z79899 Other long term (current) drug therapy: Secondary | ICD-10-CM | POA: Diagnosis not present

## 2019-03-02 DIAGNOSIS — I10 Essential (primary) hypertension: Secondary | ICD-10-CM | POA: Diagnosis not present

## 2019-03-02 DIAGNOSIS — Z87891 Personal history of nicotine dependence: Secondary | ICD-10-CM | POA: Diagnosis not present

## 2019-03-02 DIAGNOSIS — R631 Polydipsia: Secondary | ICD-10-CM | POA: Diagnosis not present

## 2019-03-02 DIAGNOSIS — R739 Hyperglycemia, unspecified: Secondary | ICD-10-CM | POA: Diagnosis not present

## 2019-03-02 LAB — CBC WITH DIFFERENTIAL/PLATELET
Abs Immature Granulocytes: 0.01 10*3/uL (ref 0.00–0.07)
Basophils Absolute: 0 10*3/uL (ref 0.0–0.1)
Basophils Relative: 1 %
Eosinophils Absolute: 0.1 10*3/uL (ref 0.0–0.5)
Eosinophils Relative: 2 %
HCT: 41.7 % (ref 39.0–52.0)
Hemoglobin: 14 g/dL (ref 13.0–17.0)
Immature Granulocytes: 0 %
Lymphocytes Relative: 30 %
Lymphs Abs: 1.7 10*3/uL (ref 0.7–4.0)
MCH: 30.2 pg (ref 26.0–34.0)
MCHC: 33.6 g/dL (ref 30.0–36.0)
MCV: 89.9 fL (ref 80.0–100.0)
Monocytes Absolute: 0.5 10*3/uL (ref 0.1–1.0)
Monocytes Relative: 9 %
Neutro Abs: 3.3 10*3/uL (ref 1.7–7.7)
Neutrophils Relative %: 58 %
Platelets: 215 10*3/uL (ref 150–400)
RBC: 4.64 MIL/uL (ref 4.22–5.81)
RDW: 12.6 % (ref 11.5–15.5)
WBC: 5.6 10*3/uL (ref 4.0–10.5)
nRBC: 0 % (ref 0.0–0.2)

## 2019-03-02 LAB — BASIC METABOLIC PANEL
Anion gap: 6 (ref 5–15)
BUN: 13 mg/dL (ref 6–20)
CO2: 30 mmol/L (ref 22–32)
Calcium: 8.8 mg/dL — ABNORMAL LOW (ref 8.9–10.3)
Chloride: 102 mmol/L (ref 98–111)
Creatinine, Ser: 0.88 mg/dL (ref 0.61–1.24)
GFR calc Af Amer: >60 mL/min (ref >60)
GFR calc non Af Amer: >60 mL/min (ref >60)
Glucose, Bld: 149 mg/dL — ABNORMAL HIGH (ref 70–99)
Potassium: 3.8 mmol/L (ref 3.5–5.1)
Sodium: 138 mmol/L (ref 135–145)

## 2019-03-02 LAB — CBG MONITORING, ED: Glucose-Capillary: 133 mg/dL — ABNORMAL HIGH (ref 70–99)

## 2019-03-02 LAB — URINALYSIS, ROUTINE W REFLEX MICROSCOPIC
Bilirubin Urine: NEGATIVE
Glucose, UA: NEGATIVE mg/dL
Hgb urine dipstick: NEGATIVE
Ketones, ur: NEGATIVE mg/dL
Leukocytes,Ua: NEGATIVE
Nitrite: NEGATIVE
Protein, ur: NEGATIVE mg/dL
Specific Gravity, Urine: 1.016 (ref 1.005–1.030)
pH: 6 (ref 5.0–8.0)

## 2019-03-02 LAB — TROPONIN I (HIGH SENSITIVITY): Troponin I (High Sensitivity): 2 ng/L (ref <18)

## 2019-03-02 NOTE — ED Triage Notes (Signed)
Pt reports he is "having diabetes symptoms" including fatigue, falling asleep after eating, blurred vision. Has been ongoing for several days.

## 2019-03-02 NOTE — ED Provider Notes (Signed)
[facility] EMERGENCY DEPARTMENT Provider Note   CSN: [id number] Arrival date & time: 03/02/19  1813     History   Chief Complaint Chief Complaint  Patient presents with  . Hyperglycemia    HPI Darrell Henry is a 33 y.o. male.     The history is provided by the patient.  Hyperglycemia Blood sugar level PTA:  Unkown Severity:  Moderate Onset quality:  Gradual Timing:  Constant Progression:  Unable to specify Chronicity:  New Diabetes status:  Non-diabetic Current diabetic therapy:  None Relieved by:  Nothing Ineffective treatments:  None tried  Pt reports increased urination,  Feeling thirsty.  Pt reports his father is diabetic and he is concerned that he is having Diabetes/high glucose symptoms  Past Medical History:  Diagnosis Date  . Hypertension   . 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    Patient Active Problem List   Diagnosis Date Noted  . Venous stasis 05/24/2016  . Morbid obesity ([place]) 04/12/2016  . HTN (hypertension) 08/06/2015    History reviewed. No pertinent surgical history.      Home Medications    Prior to Admission medications   Medication Sig Start Date End Date Taking? Authorizing Provider  lisinopril-hydrochlorothiazide (PRINZIDE,ZESTORETIC) 10-12.5 MG tablet Take 1 tablet by mouth daily. 03/26/18  Yes Dettinger, Fransisca Kaufmann, MD  Multiple Vitamin (MULTIVITAMIN) capsule Take 1 capsule by mouth daily.   Yes [provider]  SUBOXONE 2-0.5 MG FILM Take 1 Film by mouth 2 (two) times a day.  03/16/16  Yes [provider]    Family History Family History  Problem Relation Age of Onset  . Diabetes Mother   . Hypertension Mother   . Diabetes Father   . Hypertension Father     Social History Social History   Tobacco Use  . Smoking status: Former Smoker    Years: 15.00    Types: Cigarettes, E-cigarettes    Quit  date: 05/03/2012    Years since quitting: 6.8  . Smokeless tobacco: Never Used  . Tobacco comment: E CIG vape   Substance Use Topics  . Alcohol use: No  . Drug use: No    Types: Heroin    Comment: previous heroine addiction and narcotic addiction; Suboxone since 2013.     Allergies   Patient has no known allergies.   Review of Systems Review of Systems  All other systems reviewed and are negative.    Physical Exam Updated Vital Signs BP 133/85   Pulse 87   Temp 97.8 F (36.6 C) (Oral)   Resp (!) 21   Ht 6' (1.829 m)   Wt 136.1 kg   SpO2 96%   BMI 40.69 kg/m   Physical Exam Vitals signs and nursing note reviewed.  Constitutional:      Appearance: He is well-developed.  HENT:     Head: Normocephalic and atraumatic.     Right Ear: Tympanic membrane normal.     Left Ear: Tympanic membrane normal.     Nose: Nose normal.     Mouth/Throat:     Mouth: Mucous membranes are moist.  Eyes:     Conjunctiva/sclera: Conjunctivae normal.  Neck:     Musculoskeletal: Normal range of motion and neck supple.  Cardiovascular:     Rate and Rhythm: Normal rate and regular rhythm.     Heart sounds: No murmur.  Pulmonary:  Effort: Pulmonary effort is normal. No respiratory distress.     Breath sounds: Normal breath sounds.  Abdominal:     General: Abdomen is flat.     Palpations: Abdomen is soft.     Tenderness: There is no abdominal tenderness.  Musculoskeletal: Normal range of motion.  Skin:    General: Skin is warm and dry.  Neurological:     General: No focal deficit present.     Mental Status: He is alert.  Psychiatric:        Mood and Affect: Mood normal.      ED Treatments / Results  Labs (all labs ordered are listed, but only abnormal results are displayed) Labs Reviewed  BASIC METABOLIC PANEL - Abnormal; Notable for the following components:      Result Value   Glucose, Bld 149 (*)    Calcium 8.8 (*)    All other components within normal limits  CBG  MONITORING, ED - Abnormal; Notable for the following components:   Glucose-Capillary 133 (*)    All other components within normal limits  CBC WITH DIFFERENTIAL/PLATELET  URINALYSIS, ROUTINE W REFLEX MICROSCOPIC  TROPONIN I (HIGH SENSITIVITY)    EKG None  Radiology No results found.  Procedures Procedures (including critical care time)  Medications Ordered in ED Medications - No data to display   Initial Impression / Assessment and Plan / ED Course  I have reviewed the triage vital signs and the nursing notes.  Pertinent labs & imaging results that were available during my care of the patient were reviewed by me and considered in my medical decision making (see chart for details).        MDM   Pt's glucose is slightly eleated at 149.  CBC and urine are normal  Pt counseled on results.  Pt advised to see his primary MD for recheck   Final Clinical Impressions(s) / ED Diagnoses   Final diagnoses:  Hyperglycemia    ED Discharge Orders    None    An After Visit Summary was printed and given to the patient.    Elson AreasSofia, Rathana Viveros K, Cordelia Poche-C 03/02/19 2116    Samuel JesterMcManus, Kathleen, DO 03/03/19 1529

## 2019-03-02 NOTE — Discharge Instructions (Signed)
Your glucose is slightly elevated.  You need to see our primary Md for further testing

## 2019-04-11 DIAGNOSIS — Z5181 Encounter for therapeutic drug level monitoring: Secondary | ICD-10-CM | POA: Diagnosis not present

## 2019-04-11 DIAGNOSIS — I10 Essential (primary) hypertension: Secondary | ICD-10-CM | POA: Diagnosis not present

## 2019-04-23 ENCOUNTER — Telehealth: Payer: Self-pay | Admitting: Family Medicine

## 2019-04-24 ENCOUNTER — Ambulatory Visit (INDEPENDENT_AMBULATORY_CARE_PROVIDER_SITE_OTHER): Payer: BC Managed Care – PPO | Admitting: Family Medicine

## 2019-04-24 ENCOUNTER — Other Ambulatory Visit: Payer: Self-pay

## 2019-04-24 ENCOUNTER — Encounter: Payer: Self-pay | Admitting: Family Medicine

## 2019-04-24 DIAGNOSIS — I10 Essential (primary) hypertension: Secondary | ICD-10-CM | POA: Diagnosis not present

## 2019-04-24 DIAGNOSIS — R739 Hyperglycemia, unspecified: Secondary | ICD-10-CM

## 2019-04-24 LAB — BAYER DCA HB A1C WAIVED: HB A1C (BAYER DCA - WAIVED): 5.6 % (ref <7.0)

## 2019-04-24 MED ORDER — LISINOPRIL-HYDROCHLOROTHIAZIDE 10-12.5 MG PO TABS: 1.0000 | ORAL_TABLET | Freq: Every day | ORAL | 3 refills | Status: DC

## 2019-04-24 NOTE — Progress Notes (Signed)
BP 121/67   Pulse 84   Temp (!) 96.8 F (36 C) (Temporal)   Ht _0  (1.854 m)   Wt (!) 312 lb (141.5 kg)   SpO2 97%   BMI 41.16 kg/m    Subjective:   Patient ID: Darrell Henry, male    DOB: 03/15/1986, 33 y.o.   MRN: [id number]  HPI: Darrell Henry is a 33 y.o. male presenting on 04/24/2019 for Hypertension (6 month follow up )   HPI Hypertension Patient is currently on hydrochlorothiazide, and their blood pressure today is 121/67. Patient denies any lightheadedness or dizziness. Patient denies headaches, blurred vision, chest pains, shortness of breath, or weakness. Denies any side effects from medication and is content with current medication.   Weight and obesity recheck Patient is coming in for weight and obesity recheck.  Discuss dietary and lifestyle modification as we have before.  Has elevated blood sugar recently with some blurred vision episode and went into the emergency department and it did check his sugar and it was 140 and he has not had it checked since, he has not had any of these issues since that time.  Relevant past medical, surgical, family and social history reviewed and updated as indicated. Interim medical history since our last visit reviewed. Allergies and medications reviewed and updated.  Review of Systems  Constitutional: Negative for chills and fever.  Eyes: Negative for visual disturbance.  Respiratory: Negative for shortness of breath and wheezing.   Cardiovascular: Negative for chest pain and leg swelling.  Musculoskeletal: Negative for back pain and gait problem.  Skin: Negative for rash.  Neurological: Negative for dizziness, weakness and light-headedness.  All other systems reviewed and are negative.   Per HPI unless specifically indicated above   Allergies as of 04/24/2019   No Known Allergies     Medication List       Accurate as of April 24, 2019  2:11 PM. If you have any questions, ask your nurse or doctor.         lisinopril-hydrochlorothiazide 10-12.5 MG tablet Commonly known as: ZESTORETIC Take 1 tablet by mouth daily.   multivitamin capsule Take 1 capsule by mouth daily.   Suboxone 2-0.5 MG Film Generic drug: Buprenorphine HCl-Naloxone HCl Take 1 Film by mouth 2 (two) times a day.        Objective:   BP 121/67   Pulse 84   Temp (!) 96.8 F (36 C) (Temporal)   Ht _1  (1.854 m)   Wt (!) 312 lb (141.5 kg)   SpO2 97%   BMI 41.16 kg/m   Wt Readings from Last 3 Encounters:  04/24/19 (!) 312 lb (141.5 kg)  03/02/19 300 lb (136.1 kg)  12/30/18 290 lb (131.5 kg)    Physical Exam Vitals signs and nursing note reviewed.  Constitutional:      General: He is not in acute distress.    Appearance: He is well-developed. He is not diaphoretic.  Eyes:     General: No scleral icterus.    Conjunctiva/sclera: Conjunctivae normal.  Neck:     Musculoskeletal: Neck supple.     Thyroid: No thyromegaly.  Cardiovascular:     Rate and Rhythm: Normal rate and regular rhythm.     Heart sounds: Normal heart sounds. No murmur.  Pulmonary:     Effort: Pulmonary effort is normal. No respiratory distress.     Breath sounds: Normal breath sounds. No wheezing.  Musculoskeletal: Normal range of motion.  Lymphadenopathy:  Cervical: No cervical adenopathy.  Skin:    General: Skin is warm and dry.     Findings: No rash.  Neurological:     Mental Status: He is alert and oriented to person, place, and time.     Coordination: Coordination normal.  Psychiatric:        Behavior: Behavior normal.       Assessment & Plan:   Problem List Items Addressed This Visit      Cardiovascular and Mediastinum   HTN (hypertension)   Relevant Medications   lisinopril-hydrochlorothiazide (ZESTORETIC) 10-12.5 MG tablet   Other Relevant Orders   CMP14+EGFR   Lipid panel     Other   Morbid obesity ([place]) - Primary   Relevant Orders   Lipid panel    Other Visit Diagnoses    Hyperglycemia        Relevant Orders   Lipid panel   Bayer DCA Hb A1c Waived      Continue lisinopril hydrochlorothiazide, no changes, will check A1c. Follow up plan: Return in about 6 months (around 10/22/2019), or if symptoms worsen or fail to improve, for Hypertension and weight recheck.  Counseling provided for all of the vaccine components No orders of the defined types were placed in this encounter.   Caryl Pina, MD [facility] 04/24/2019, 2:11 PM

## 2019-04-25 LAB — LIPID PANEL
Chol/HDL Ratio: 4.3 ratio (ref 0.0–5.0)
Cholesterol, Total: 156 mg/dL (ref 100–199)
HDL: 36 mg/dL — ABNORMAL LOW (ref >39)
LDL Chol Calc (NIH): 83 mg/dL (ref 0–99)
Triglycerides: 219 mg/dL — ABNORMAL HIGH (ref 0–149)
VLDL Cholesterol Cal: 37 mg/dL (ref 5–40)

## 2019-04-25 LAB — CMP14+EGFR
ALT: 52 IU/L — ABNORMAL HIGH (ref 0–44)
AST: 41 IU/L — ABNORMAL HIGH (ref 0–40)
Albumin/Globulin Ratio: 1.6 (ref 1.2–2.2)
Albumin: 4.1 g/dL (ref 4.0–5.0)
Alkaline Phosphatase: 42 IU/L (ref 39–117)
BUN/Creatinine Ratio: 14 (ref 9–20)
BUN: 11 mg/dL (ref 6–20)
Bilirubin Total: 0.2 mg/dL (ref 0.0–1.2)
CO2: 29 mmol/L (ref 20–29)
Calcium: 8.9 mg/dL (ref 8.7–10.2)
Chloride: 103 mmol/L (ref 96–106)
Creatinine, Ser: 0.8 mg/dL (ref 0.76–1.27)
GFR calc Af Amer: 135 mL/min/{1.73_m2} (ref >59)
GFR calc non Af Amer: 117 mL/min/{1.73_m2} (ref >59)
Globulin, Total: 2.6 g/dL (ref 1.5–4.5)
Glucose: 98 mg/dL (ref 65–99)
Potassium: 3.9 mmol/L (ref 3.5–5.2)
Sodium: 144 mmol/L (ref 134–144)
Total Protein: 6.7 g/dL (ref 6.0–8.5)

## 2019-07-04 DIAGNOSIS — I10 Essential (primary) hypertension: Secondary | ICD-10-CM | POA: Diagnosis not present

## 2019-07-15 DIAGNOSIS — G4733 Obstructive sleep apnea (adult) (pediatric): Secondary | ICD-10-CM | POA: Diagnosis not present

## 2019-07-15 DIAGNOSIS — R0683 Snoring: Secondary | ICD-10-CM | POA: Diagnosis not present

## 2019-07-15 DIAGNOSIS — F1120 Opioid dependence, uncomplicated: Secondary | ICD-10-CM | POA: Diagnosis not present

## 2019-07-15 DIAGNOSIS — I10 Essential (primary) hypertension: Secondary | ICD-10-CM | POA: Diagnosis not present

## 2019-10-23 ENCOUNTER — Encounter: Payer: Self-pay | Admitting: Family Medicine

## 2019-10-23 ENCOUNTER — Ambulatory Visit (INDEPENDENT_AMBULATORY_CARE_PROVIDER_SITE_OTHER): Payer: BC Managed Care – PPO | Admitting: Family Medicine

## 2019-10-23 ENCOUNTER — Other Ambulatory Visit: Payer: Self-pay

## 2019-10-23 DIAGNOSIS — E785 Hyperlipidemia, unspecified: Secondary | ICD-10-CM | POA: Diagnosis not present

## 2019-10-23 DIAGNOSIS — I10 Essential (primary) hypertension: Secondary | ICD-10-CM | POA: Diagnosis not present

## 2019-10-23 NOTE — Progress Notes (Signed)
BP 110/67   Pulse 98   Temp 99.1 F (37.3 C)   Ht '6\' 1"'  (1.854 m)   Wt 290 lb (131.5 kg)   SpO2 97%   BMI 38.26 kg/m    Subjective:   Patient ID: Darrell Henry, male    DOB: Feb 08, 1986, 34 y.o.   MRN: [id number]  HPI: Darrell Henry is a 34 y.o. male presenting on 10/23/2019 for Medical Management of Chronic Issues (22m and Hypertension   HPI Hypertension Patient is currently on lisinopril hydrochlorothiazide, and their blood pressure today is 110/67. Patient denies any lightheadedness or dizziness. Patient denies headaches, blurred vision, chest pains, shortness of breath, or weakness. Denies any side effects from medication and is content with current medication.   Hyperlipidemia Patient is coming in for recheck of his hyperlipidemia. The patient is currently taking no medication currently, he has been try to do diet exercise we will see where it is today.. They deny any issues with myalgias or history of liver damage from it. They deny any focal numbness or weakness or chest pain.   Patient's hypertension and cholesterol are more complicated by the patient's morbid obesity.  Discussed weight loss and lifestyle modification and exercise with the patient.   Relevant past medical, surgical, family and social history reviewed and updated as indicated. Interim medical history since our last visit reviewed. Allergies and medications reviewed and updated.  Review of Systems  Constitutional: Negative for chills and fever.  Eyes: Negative for visual disturbance.  Respiratory: Negative for shortness of breath and wheezing.   Cardiovascular: Negative for chest pain and leg swelling.  Musculoskeletal: Negative for back pain and gait problem.  Skin: Negative for rash.  Neurological: Negative for dizziness, weakness and numbness.  All other systems reviewed and are negative.   Per HPI unless specifically indicated above   Allergies as of 10/23/2019   No Known Allergies      Medication List       Accurate as of October 23, 2019  2:21 PM. If you have any questions, ask your nurse or doctor.        STOP taking these medications   multivitamin capsule Stopped by: JFransisca KaufmannDettinger, MD     TAKE these medications   Echinacea 125 MG Caps Take by mouth daily.   lisinopril-hydrochlorothiazide 10-12.5 MG tablet Commonly known as: ZESTORETIC Take 1 tablet by mouth daily.   Suboxone 2-0.5 MG Film Generic drug: Buprenorphine HCl-Naloxone HCl Take 1 Film by mouth 2 (two) times a day.        Objective:   BP 110/67   Pulse 98   Temp 99.1 F (37.3 C)   Ht '6\' 1"'  (1.854 m)   Wt 290 lb (131.5 kg)   SpO2 97%   BMI 38.26 kg/m   Wt Readings from Last 3 Encounters:  10/23/19 290 lb (131.5 kg)  04/24/19 (!) 312 lb (141.5 kg)  03/02/19 300 lb (136.1 kg)    Physical Exam Vitals and nursing note reviewed.  Constitutional:      General: He is not in acute distress.    Appearance: He is well-developed. He is not diaphoretic.  Eyes:     General: No scleral icterus.       Right eye: No discharge.     Conjunctiva/sclera: Conjunctivae normal.     Pupils: Pupils are equal, round, and reactive to light.  Neck:     Thyroid: No thyromegaly.  Cardiovascular:     Rate and Rhythm: Normal rate  and regular rhythm.     Heart sounds: Normal heart sounds. No murmur.  Pulmonary:     Effort: Pulmonary effort is normal. No respiratory distress.     Breath sounds: Normal breath sounds. No wheezing.  Musculoskeletal:        General: Normal range of motion.     Cervical back: Neck supple.  Lymphadenopathy:     Cervical: No cervical adenopathy.  Skin:    General: Skin is warm and dry.     Findings: No rash.  Neurological:     Mental Status: He is alert and oriented to person, place, and time.     Coordination: Coordination normal.  Psychiatric:        Behavior: Behavior normal.       Assessment & Plan:   Problem List Items Addressed This Visit       Cardiovascular and Mediastinum   HTN (hypertension)   Relevant Orders   CBC with Differential/Platelet   CMP14+EGFR     Other   Morbid obesity ([place]) - Primary   Relevant Orders   CBC with Differential/Platelet   Lipid panel   Dyslipidemia   Relevant Orders   CBC with Differential/Platelet   Lipid panel      Continue lisinopril hydrochlorothiazide, he continues see pain management clinic.  We will check blood work today. Follow up plan: Return in about 6 months (around 04/24/2020), or if symptoms worsen or fail to improve, for Hypertension and cholesterol recheck.  Counseling provided for all of the vaccine components Orders Placed This Encounter  Procedures  . CBC with Differential/Platelet  . CMP14+EGFR  . Lipid panel    Caryl Pina, MD [facility] 10/23/2019, 2:21 PM

## 2019-10-24 LAB — CMP14+EGFR
ALT: 43 IU/L (ref 0–44)
AST: 30 IU/L (ref 0–40)
Albumin/Globulin Ratio: 1.8 (ref 1.2–2.2)
Albumin: 4.5 g/dL (ref 4.0–5.0)
Alkaline Phosphatase: 41 IU/L (ref 39–117)
BUN/Creatinine Ratio: 20 (ref 9–20)
BUN: 17 mg/dL (ref 6–20)
Bilirubin Total: 0.3 mg/dL (ref 0.0–1.2)
CO2: 25 mmol/L (ref 20–29)
Calcium: 9.4 mg/dL (ref 8.7–10.2)
Chloride: 101 mmol/L (ref 96–106)
Creatinine, Ser: 0.83 mg/dL (ref 0.76–1.27)
GFR calc Af Amer: 133 mL/min/{1.73_m2} (ref >59)
GFR calc non Af Amer: 115 mL/min/{1.73_m2} (ref >59)
Globulin, Total: 2.5 g/dL (ref 1.5–4.5)
Glucose: 94 mg/dL (ref 65–99)
Potassium: 4.3 mmol/L (ref 3.5–5.2)
Sodium: 140 mmol/L (ref 134–144)
Total Protein: 7 g/dL (ref 6.0–8.5)

## 2019-10-24 LAB — CBC WITH DIFFERENTIAL/PLATELET
Basophils Absolute: 0.1 10*3/uL (ref 0.0–0.2)
Basos: 1 %
EOS (ABSOLUTE): 0.1 10*3/uL (ref 0.0–0.4)
Eos: 2 %
Hematocrit: 42.2 % (ref 37.5–51.0)
Hemoglobin: 14.7 g/dL (ref 13.0–17.7)
Immature Grans (Abs): 0 10*3/uL (ref 0.0–0.1)
Immature Granulocytes: 0 %
Lymphocytes Absolute: 2 10*3/uL (ref 0.7–3.1)
Lymphs: 37 %
MCH: 30.6 pg (ref 26.6–33.0)
MCHC: 34.8 g/dL (ref 31.5–35.7)
MCV: 88 fL (ref 79–97)
Monocytes Absolute: 0.5 10*3/uL (ref 0.1–0.9)
Monocytes: 10 %
Neutrophils Absolute: 2.7 10*3/uL (ref 1.4–7.0)
Neutrophils: 50 %
Platelets: 222 10*3/uL (ref 150–450)
RBC: 4.8 x10E6/uL (ref 4.14–5.80)
RDW: 12.4 % (ref 11.6–15.4)
WBC: 5.3 10*3/uL (ref 3.4–10.8)

## 2019-10-24 LAB — LIPID PANEL
Chol/HDL Ratio: 3.5 ratio (ref 0.0–5.0)
Cholesterol, Total: 156 mg/dL (ref 100–199)
HDL: 44 mg/dL (ref >39)
LDL Chol Calc (NIH): 85 mg/dL (ref 0–99)
Triglycerides: 154 mg/dL — ABNORMAL HIGH (ref 0–149)
VLDL Cholesterol Cal: 27 mg/dL (ref 5–40)

## 2020-02-21 ENCOUNTER — Ambulatory Visit (INDEPENDENT_AMBULATORY_CARE_PROVIDER_SITE_OTHER): Payer: PRIVATE HEALTH INSURANCE | Admitting: Family Medicine

## 2020-02-21 ENCOUNTER — Ambulatory Visit (INDEPENDENT_AMBULATORY_CARE_PROVIDER_SITE_OTHER): Payer: PRIVATE HEALTH INSURANCE

## 2020-02-21 ENCOUNTER — Other Ambulatory Visit: Payer: Self-pay

## 2020-02-21 ENCOUNTER — Encounter: Payer: Self-pay | Admitting: Family Medicine

## 2020-02-21 VITALS — BP 135/74 | HR 103 | Ht 72.0 in | Wt 294.0 lb

## 2020-02-21 DIAGNOSIS — M792 Neuralgia and neuritis, unspecified: Secondary | ICD-10-CM

## 2020-02-21 DIAGNOSIS — M545 Low back pain, unspecified: Secondary | ICD-10-CM

## 2020-02-21 IMAGING — DX DG LUMBAR SPINE 2-3V
2 series · 2 of 2 positions shown · non-contrast
Comparison: None.

CLINICAL DATA: Lumbar pain claudication

EXAM:
LUMBAR SPINE - 2-3 VIEW

[l-spine ap]
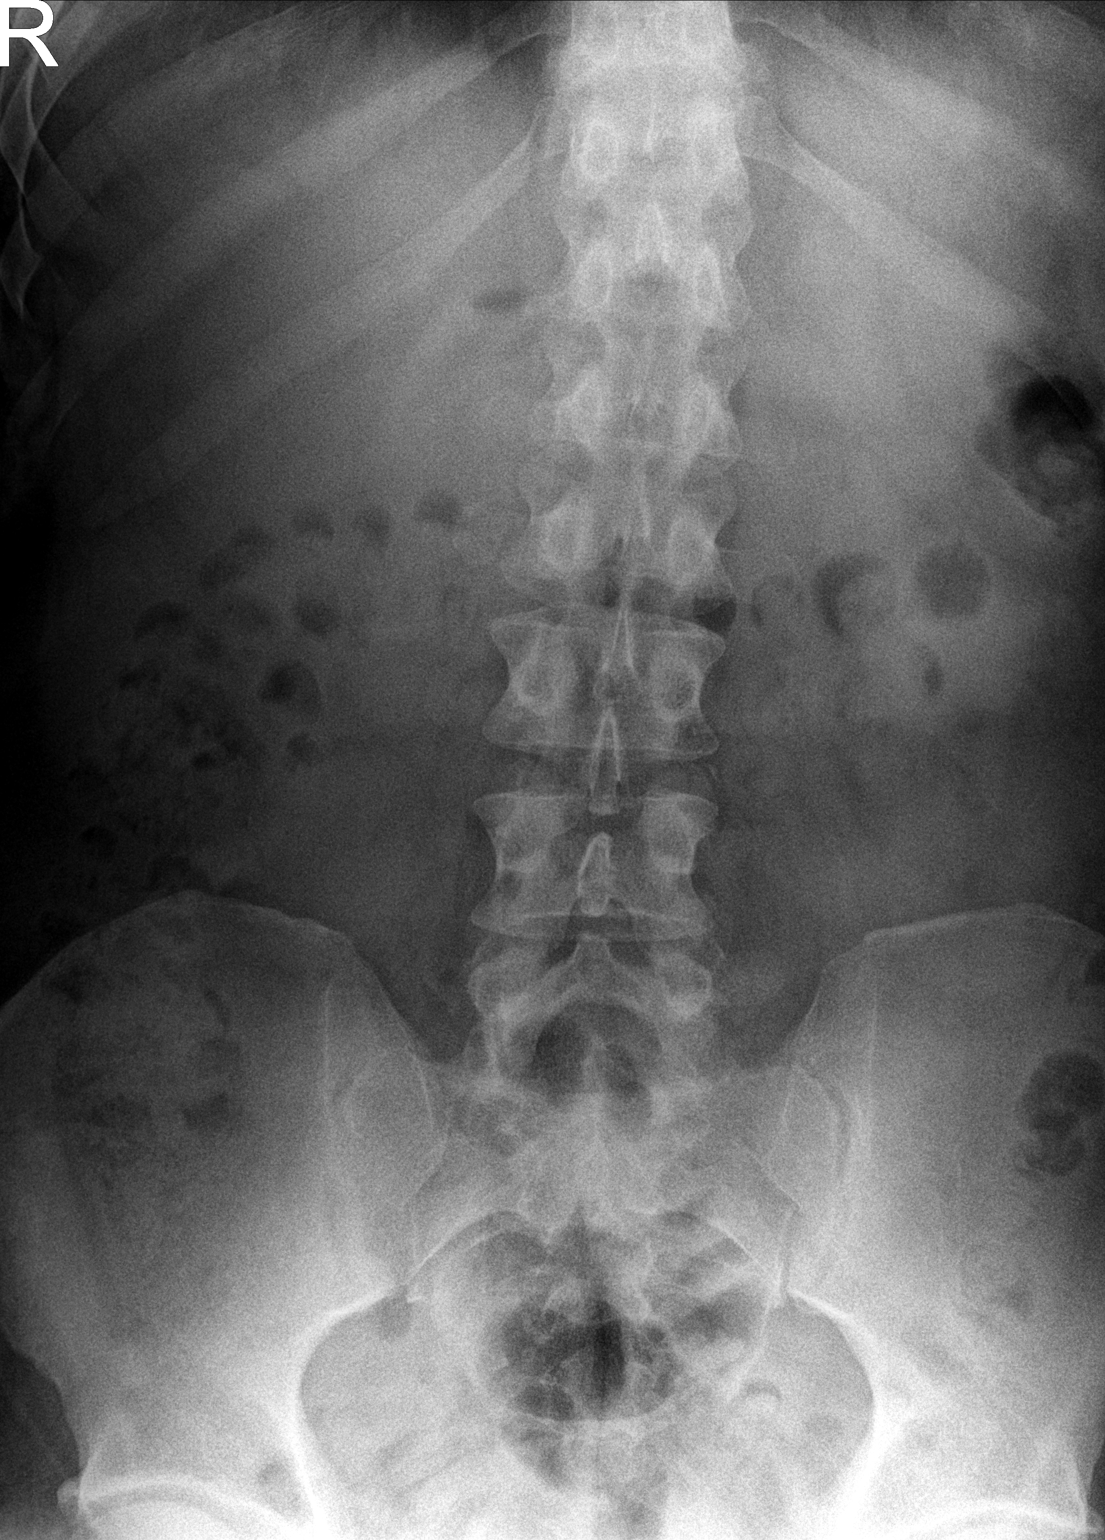

[l-spine lat]
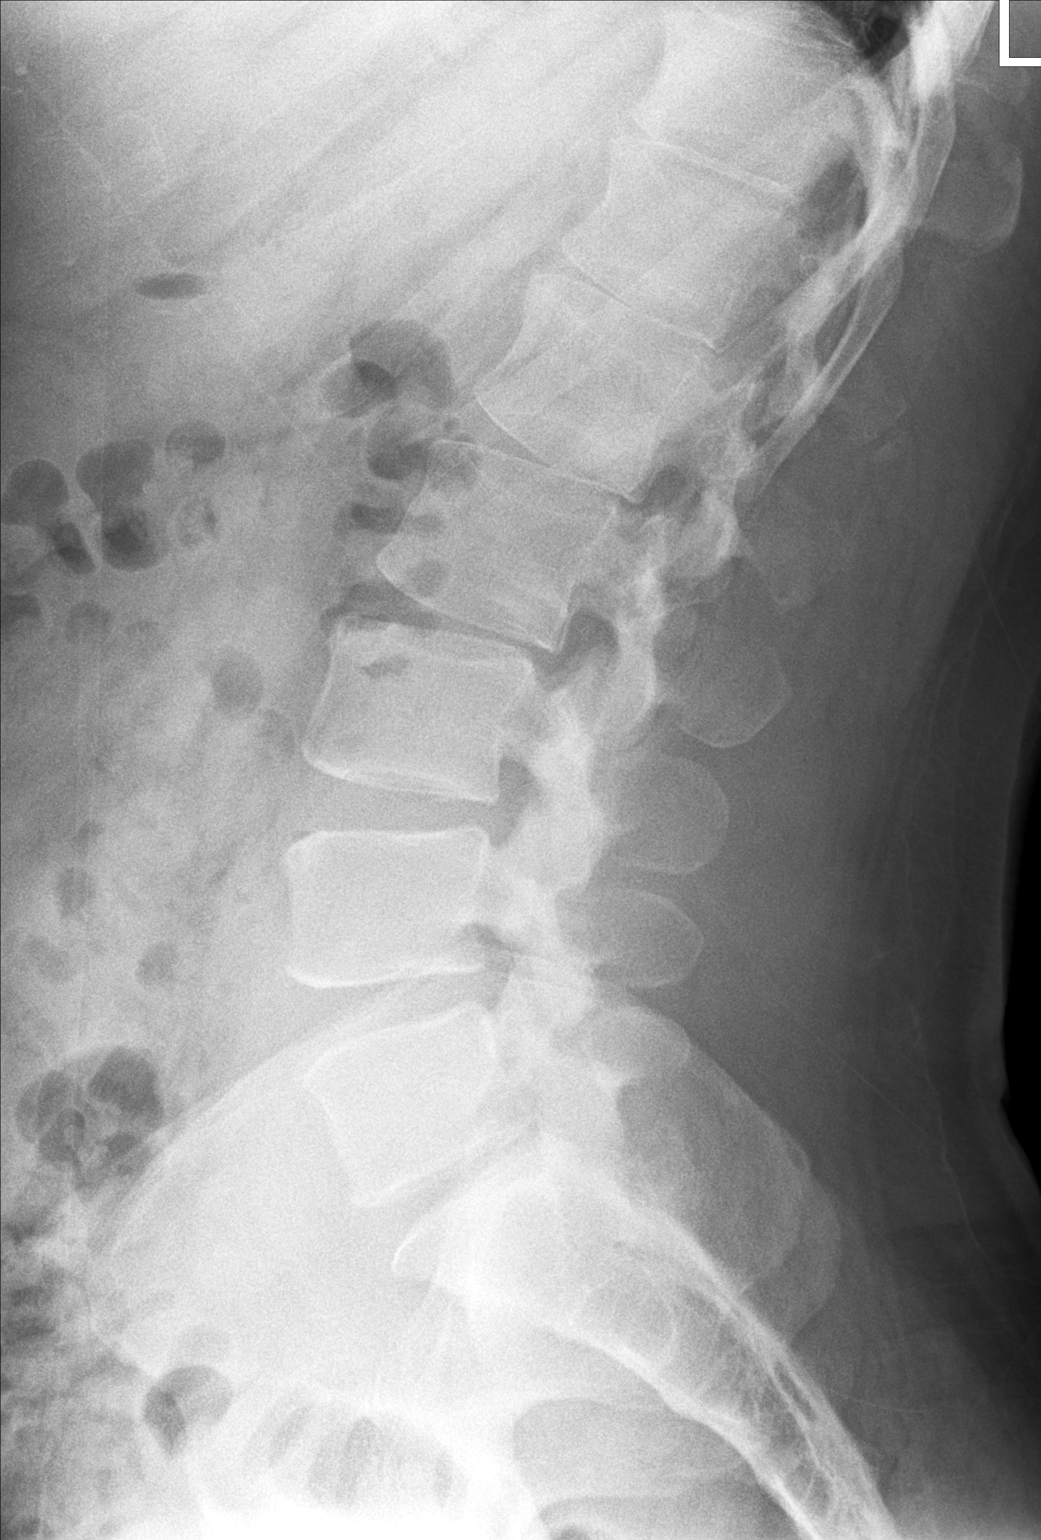

[2 of 2 positions shown; findings below may reference images not displayed]

FINDINGS: Minimal scoliosis. Trace retrolisthesis L2 on L3. Vertebral body
heights are maintained. Mild disc space narrowing at L5-S1.
IMPRESSION: 1. Minimal scoliosis.
2. Mild disc space narrowing at L5-S1.

## 2020-02-21 MED ORDER — METHYLPREDNISOLONE ACETATE 80 MG/ML IJ SUSP
80.0000 mg | Freq: Once | INTRAMUSCULAR | Status: AC
  Administered 2020-02-21: 80 mg via INTRAMUSCULAR
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
## 2020-02-22 LAB — VITAMIN B12: Vitamin B-12: 670 pg/mL (ref 232–1245)

## 2020-02-22 LAB — TSH: TSH: 2.92 u[IU]/mL (ref 0.450–4.500)

## 2020-04-24 ENCOUNTER — Ambulatory Visit (INDEPENDENT_AMBULATORY_CARE_PROVIDER_SITE_OTHER): Payer: PRIVATE HEALTH INSURANCE | Admitting: Family Medicine

## 2020-04-24 ENCOUNTER — Other Ambulatory Visit: Payer: Self-pay

## 2020-04-24 ENCOUNTER — Encounter: Payer: Self-pay | Admitting: Family Medicine

## 2020-04-24 VITALS — BP 110/74 | HR 102 | Temp 97.3°F | Ht 72.0 in | Wt 295.0 lb

## 2020-04-24 DIAGNOSIS — I10 Essential (primary) hypertension: Secondary | ICD-10-CM | POA: Diagnosis not present

## 2020-04-24 DIAGNOSIS — G5623 Lesion of ulnar nerve, bilateral upper limbs: Secondary | ICD-10-CM

## 2020-04-24 DIAGNOSIS — E785 Hyperlipidemia, unspecified: Secondary | ICD-10-CM

## 2020-04-24 DIAGNOSIS — M792 Neuralgia and neuritis, unspecified: Secondary | ICD-10-CM

## 2020-04-24 DIAGNOSIS — M545 Low back pain, unspecified: Secondary | ICD-10-CM

## 2020-04-24 MED ORDER — LISINOPRIL-HYDROCHLOROTHIAZIDE 10-12.5 MG PO TABS: 1.0000 | ORAL_TABLET | Freq: Every day | ORAL | 3 refills | Status: DC

## 2020-04-24 NOTE — Progress Notes (Signed)
SUBJECTIVE:   CC: "Sensation Changes in back and fingers"  HPI:   Patient is a 34yo caucasian male who presents to clinic today for his routine follow up. Patient states he is doing fine with his blood pressure medications and is working on losing weight.  Hypertension Patient is currently on lisinopril hydrochlorothiazide, and their blood pressure today is 110/74. Patient denies any lightheadedness or dizziness. Patient denies headaches, blurred vision, chest pains, shortness of breath, or weakness. Denies any side effects from medication and is content with current medication.   Patient has been trying to lose weight  Hyperlipidemia Patient is coming in for recheck of his hyperlipidemia. The patient is currently taking no medication currently, he has been try to do diet exercise we will see where it is today. They deny any focal numbness or weakness or chest pain.   Morbid Obesity: Patient is currently working on his weight loss and weighs  295lbs on today's visit. He states he is currently working on weight loss.  Currently he is up 5 pounds in the past 6 months.  He did fluctuate up and then came back down though.  Flank Pain:  Patient states he is still concerned about mild flank pain on the right lower flank. He states he did not see much improvement following the cortisone injection from this visit on 02/21/2020. He denies known triggers but states he has an increase in the pain after sitting for a long period of time and also notes difficulty walking. He denies weakness, numbness or tingling in the lower extremities.  He did some prednisone from his last visit and he got better but not fully better now still there.  Decreased Sensation in Fingers: Patient state he has recently noticed decreased sensation of his 4th and 5th digits bilaterally. He states he does not have any pain, numbness, tingling, temperature changes or discoloration of these digits. He states the only time he notices  this feeling is when he touches something with these digits. He also denies neck or arm pain but does state he did noticed that he has had some increase in the sensation following certain neck movements.   PMH: Hypertension, Morbid Obesity, Venous Stasis, Dyslipidemia, chronic opioid abuse.  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 He has chronic opioid abuse and is currently being treated by pain management with suboxone. He endorses E-cigarette use.  Allergies: NKDA  Current Outpatient Medications:   Lisinopril-hydrochlorathiazide   Suboxone- managed by pain mainagement.    ROS:   Const: Denies fever, chills, or night sweats  HEENT: Denies headache   Cardiac: Denies chest pain or palpitations   Pulmonary: Endorses DOE; denies shortness of breath or cough   GI: Denies nausea, vomiting, diarrhea, or loss of control of bowels.  GU: Denies changes in urination, dysuria, or hematuria.   MSK: Denies weakness, decreased range of motion.  Neuro: see HPI   OBJECTIVE: Vitals  BP 110/74  Pulse 102Important   Temp 97.3 F (36.3 C)Important   Ht 6' (1.829 m)  Wt 133.8 kg  SpO2 96%  BMI 40.01 kg/m  BSA 2.61 m    Physical Exam:  General: Awake, alert, well nourished, in no acute distress    Cardio: regular rate and rhythm, S1S2 heard, no murmurs appreciated  Pulm: clear to auscultation bilaterally, no wheezes, rhonchi or rales; normal work of breathing on room air  GI: soft, non-tender, non-distended   MSK: Normal  range of motion in lower extremities, no pain to palpation along spine; Pain to palpation at right lower flank to deep palpation. Negative straight leg test bilaterally.    Neuro: Sensation intact to light touch in bilateral lower extremities. Decreased sensation to in the ulnar distribution right greater than left in the 4th and 5th digit. Tinel's test at elbow was positive on right  but negative  on left. No sensory changes to flexion of the neck.  Patient has subjective sensory loss in his fourth and fifth digits on both hands.  Assessment:  1. Hypertension  2. Hyperlipidemia  3. Morbid Obesity  4. Lower Back pain  5. Ulnar Nerve entrapment   Plan: Patient's condition was discussed with him and the following plan was determined:   1. Hypertension- Continue Zestoretic 10-12.64m one tablet by mouth daily for blood pressure control.  Patient's blood pressure has been coming down with him trying to lose weight and eat healthier, will continue to monitor but may need to cut back in the future. 2. Hyperlipidemia- we will check lipid panel today, we can discuss medical management if lab results suggest need.  No change in medication. 3. Morbid Obesity- continue working on weight loss.  4. Lower Back pain- We will refer to Neurology for further evaluation and we discussed ordering and MRI- lumbar spine following neurologic evaluation of sensory changes in hands.  We were considering MRI but with the new possible nerve TRAM meant that could be spinal versus ulnar from his arms, we will send to neurology and they can do testing and then we can see if we need an MRI from there. 5. Ulnar Nerve entrapment- We will refer to neurology for nerve conduction study and further neurologic assessment and will consider MRI- cervical spine following this assessment.    Plan was discussed with Dr. JCaryl Pina MD who is in agreement with stated plan.   SMaxie BetterPA-S   Problem List Items Addressed This Visit      Cardiovascular and Mediastinum   HTN (hypertension) - Primary   Relevant Medications   lisinopril-hydrochlorothiazide (ZESTORETIC) 10-12.5 MG tablet   Other Relevant Orders   CBC with Differential/Platelet   CMP14+EGFR     Other   Morbid obesity ([place]   Dyslipidemia   Relevant Orders   Lipid panel    Other Visit Diagnoses    Lumbar pain       Relevant Orders    Ambulatory referral to Neurology   Peripheral neurogenic pain       Relevant Orders   Ambulatory referral to Neurology   Ulnar neuropathy of both upper extremities       Relevant Orders   Ambulatory referral to Neurology       Patient was seen and examined with SMaxie Better PA student, agree with assessment and plan above.  We will do referral to neurology. JCaryl Pina MD [facility] 04/24/2020, 4:13 PM

## 2020-04-25 LAB — CMP14+EGFR
ALT: 65 IU/L — ABNORMAL HIGH (ref 0–44)
AST: 41 IU/L — ABNORMAL HIGH (ref 0–40)
Albumin/Globulin Ratio: 1.6 (ref 1.2–2.2)
Albumin: 4.6 g/dL (ref 4.0–5.0)
Alkaline Phosphatase: 45 IU/L — ABNORMAL LOW (ref 48–121)
BUN/Creatinine Ratio: 15 (ref 9–20)
BUN: 14 mg/dL (ref 6–20)
Bilirubin Total: 0.3 mg/dL (ref 0.0–1.2)
CO2: 28 mmol/L (ref 20–29)
Calcium: 9.6 mg/dL (ref 8.7–10.2)
Chloride: 100 mmol/L (ref 96–106)
Creatinine, Ser: 0.93 mg/dL (ref 0.76–1.27)
GFR calc Af Amer: 123 mL/min/{1.73_m2} (ref >59)
GFR calc non Af Amer: 106 mL/min/{1.73_m2} (ref >59)
Globulin, Total: 2.8 g/dL (ref 1.5–4.5)
Glucose: 87 mg/dL (ref 65–99)
Potassium: 4.1 mmol/L (ref 3.5–5.2)
Sodium: 142 mmol/L (ref 134–144)
Total Protein: 7.4 g/dL (ref 6.0–8.5)

## 2020-04-25 LAB — LIPID PANEL
Chol/HDL Ratio: 4.6 ratio (ref 0.0–5.0)
Cholesterol, Total: 174 mg/dL (ref 100–199)
HDL: 38 mg/dL — ABNORMAL LOW (ref >39)
LDL Chol Calc (NIH): 101 mg/dL — ABNORMAL HIGH (ref 0–99)
Triglycerides: 202 mg/dL — ABNORMAL HIGH (ref 0–149)
VLDL Cholesterol Cal: 35 mg/dL (ref 5–40)

## 2020-04-25 LAB — CBC WITH DIFFERENTIAL/PLATELET
Basophils Absolute: 0.1 10*3/uL (ref 0.0–0.2)
Basos: 1 %
EOS (ABSOLUTE): 0.1 10*3/uL (ref 0.0–0.4)
Eos: 2 %
Hematocrit: 46.5 % (ref 37.5–51.0)
Hemoglobin: 16.1 g/dL (ref 13.0–17.7)
Immature Grans (Abs): 0 10*3/uL (ref 0.0–0.1)
Immature Granulocytes: 0 %
Lymphocytes Absolute: 2.5 10*3/uL (ref 0.7–3.1)
Lymphs: 30 %
MCH: 30.6 pg (ref 26.6–33.0)
MCHC: 34.6 g/dL (ref 31.5–35.7)
MCV: 88 fL (ref 79–97)
Monocytes Absolute: 0.8 10*3/uL (ref 0.1–0.9)
Monocytes: 9 %
Neutrophils Absolute: 4.8 10*3/uL (ref 1.4–7.0)
Neutrophils: 58 %
Platelets: 263 10*3/uL (ref 150–450)
RBC: 5.27 x10E6/uL (ref 4.14–5.80)
RDW: 13.1 % (ref 11.6–15.4)
WBC: 8.3 10*3/uL (ref 3.4–10.8)

## 2020-05-21 ENCOUNTER — Other Ambulatory Visit: Payer: Self-pay | Admitting: Family Medicine

## 2020-05-21 DIAGNOSIS — I10 Essential (primary) hypertension: Secondary | ICD-10-CM

## 2020-06-17 ENCOUNTER — Ambulatory Visit: Payer: PRIVATE HEALTH INSURANCE | Admitting: Family Medicine

## 2020-06-19 ENCOUNTER — Encounter: Payer: Self-pay | Admitting: Family Medicine

## 2020-07-02 ENCOUNTER — Ambulatory Visit: Payer: PRIVATE HEALTH INSURANCE | Admitting: Neurology

## 2020-07-02 ENCOUNTER — Encounter: Payer: Self-pay | Admitting: Neurology

## 2020-07-02 ENCOUNTER — Telehealth: Payer: Self-pay | Admitting: *Deleted

## 2020-07-02 NOTE — Telephone Encounter (Signed)
No showed new patient appointment. 

## 2020-08-17 ENCOUNTER — Other Ambulatory Visit: Payer: Self-pay | Admitting: Family Medicine

## 2020-08-17 DIAGNOSIS — I10 Essential (primary) hypertension: Secondary | ICD-10-CM

## 2020-09-04 ENCOUNTER — Telehealth: Payer: Self-pay | Admitting: Family Medicine

## 2020-09-04 DIAGNOSIS — I10 Essential (primary) hypertension: Secondary | ICD-10-CM

## 2020-09-04 NOTE — Telephone Encounter (Signed)
  Prescription Request  09/04/2020  What is the name of the medication or equipment? Lisinopril  Have you contacted your pharmacy to request a refill? (if applicable) yes  Which pharmacy would you like this sent to?  [facility]    *pt has moved and would like to have a prescription sent in to last him until he can find another PCP*  Patient notified that their request is being sent to the clinical staff for review and that they should receive a response within 2 business days.

## 2020-09-04 NOTE — Telephone Encounter (Signed)
NA/NVM, This was refilled to [facility] via incoming electronic request on 08/17/20

## 2021-02-12 ENCOUNTER — Ambulatory Visit (INDEPENDENT_AMBULATORY_CARE_PROVIDER_SITE_OTHER): Payer: PRIVATE HEALTH INSURANCE | Admitting: Family Medicine

## 2021-02-12 ENCOUNTER — Ambulatory Visit (INDEPENDENT_AMBULATORY_CARE_PROVIDER_SITE_OTHER): Payer: PRIVATE HEALTH INSURANCE

## 2021-02-12 ENCOUNTER — Other Ambulatory Visit: Payer: Self-pay

## 2021-02-12 ENCOUNTER — Encounter: Payer: Self-pay | Admitting: Family Medicine

## 2021-02-12 VITALS — BP 132/75 | HR 105 | Temp 98.1°F | Ht 72.0 in | Wt 305.4 lb

## 2021-02-12 DIAGNOSIS — R109 Unspecified abdominal pain: Secondary | ICD-10-CM | POA: Diagnosis not present

## 2021-02-12 DIAGNOSIS — R748 Abnormal levels of other serum enzymes: Secondary | ICD-10-CM | POA: Diagnosis not present

## 2021-02-12 DIAGNOSIS — M25651 Stiffness of right hip, not elsewhere classified: Secondary | ICD-10-CM

## 2021-02-12 LAB — URINALYSIS, ROUTINE W REFLEX MICROSCOPIC
Bilirubin, UA: NEGATIVE
Glucose, UA: NEGATIVE
Ketones, UA: NEGATIVE
Leukocytes,UA: NEGATIVE
Nitrite, UA: NEGATIVE
Protein,UA: NEGATIVE
RBC, UA: NEGATIVE
Specific Gravity, UA: 1.02 (ref 1.005–1.030)
Urobilinogen, Ur: 0.2 mg/dL (ref 0.2–1.0)
pH, UA: 8 — ABNORMAL HIGH (ref 5.0–7.5)

## 2021-02-12 IMAGING — DX DG HIP (WITH OR WITHOUT PELVIS) 2-3V*R*
3 series · 4 of 4 positions shown · non-contrast
Comparison: None.

CLINICAL DATA: Decreased range of motion of the RIGHT hip for 1
year.

EXAM:
DG HIP (WITH OR WITHOUT PELVIS) 2-3V RIGHT

[Series 1: pelvis ap · 0.14mm/px · 2 of 2 slices shown]
[im 1/2]
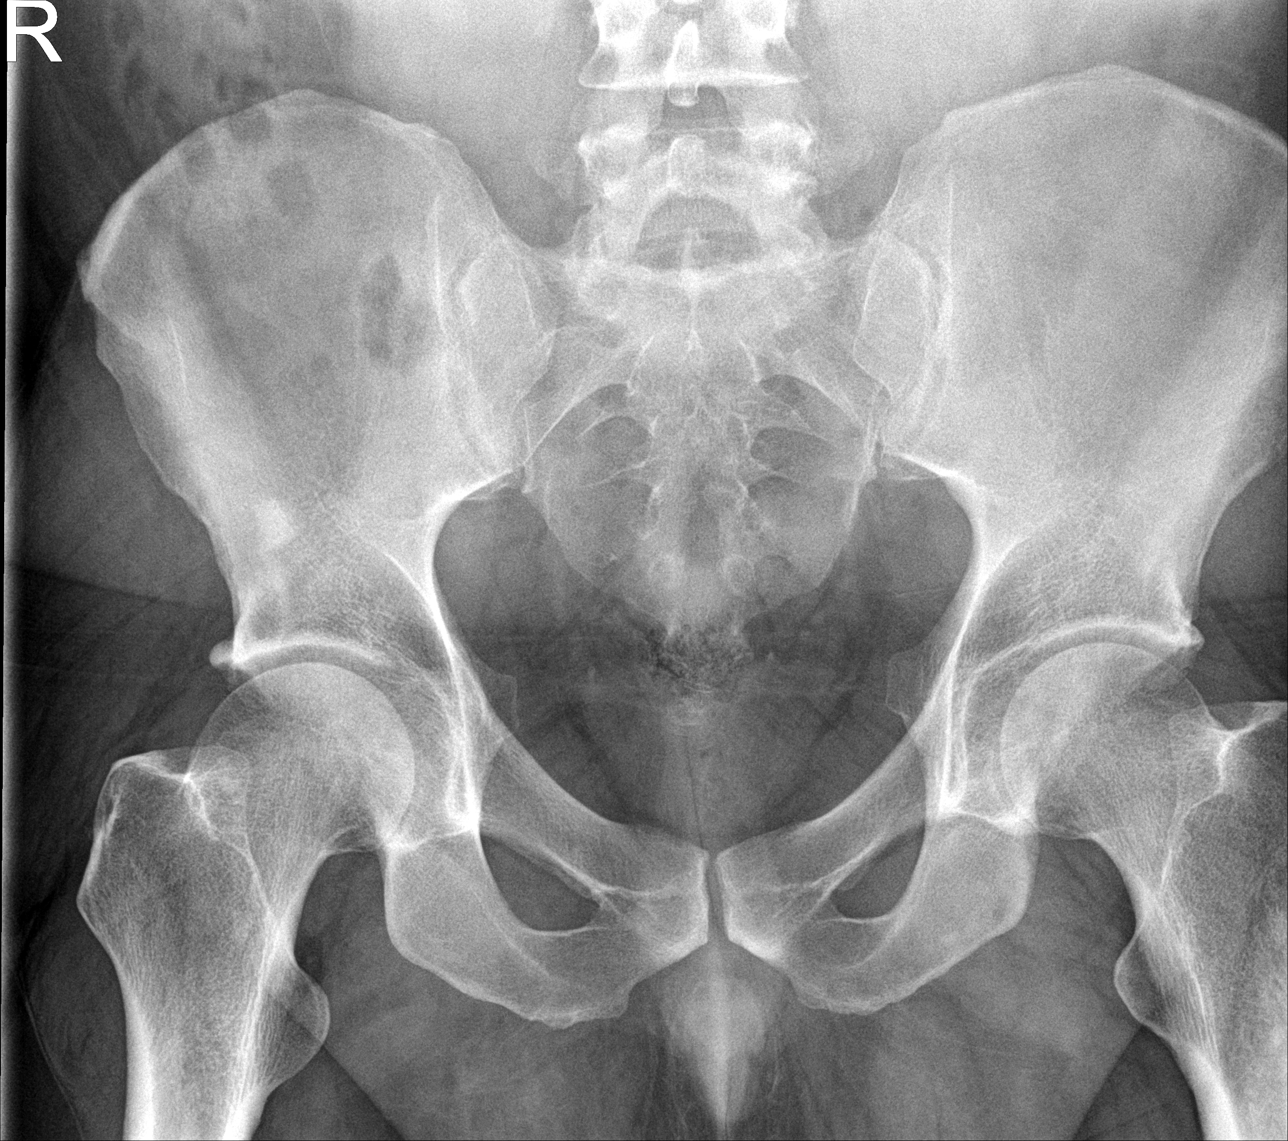
[im 2/2]
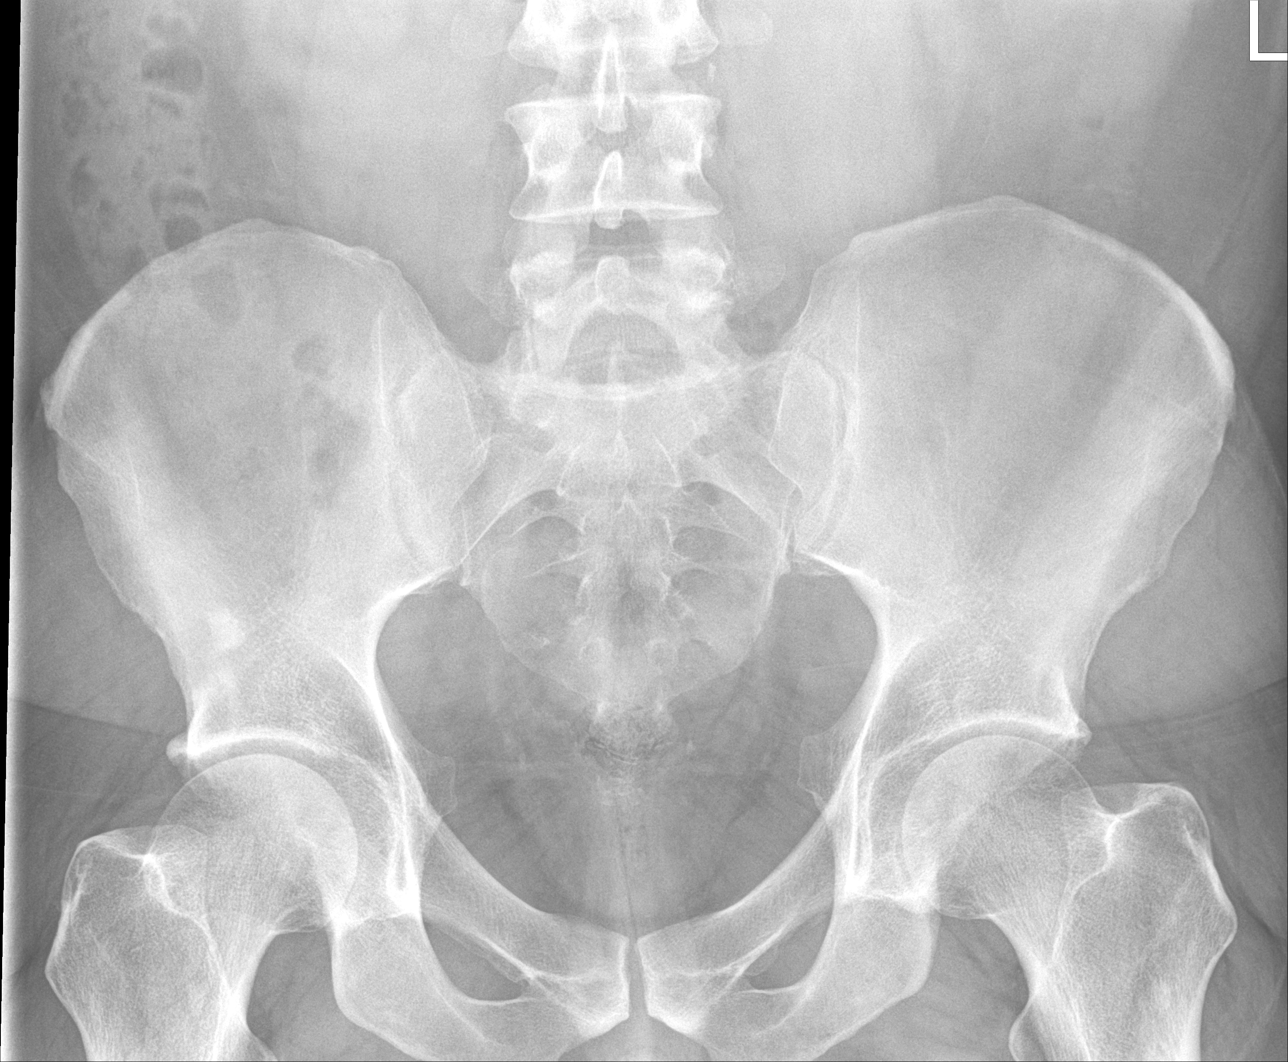

[hip ap]
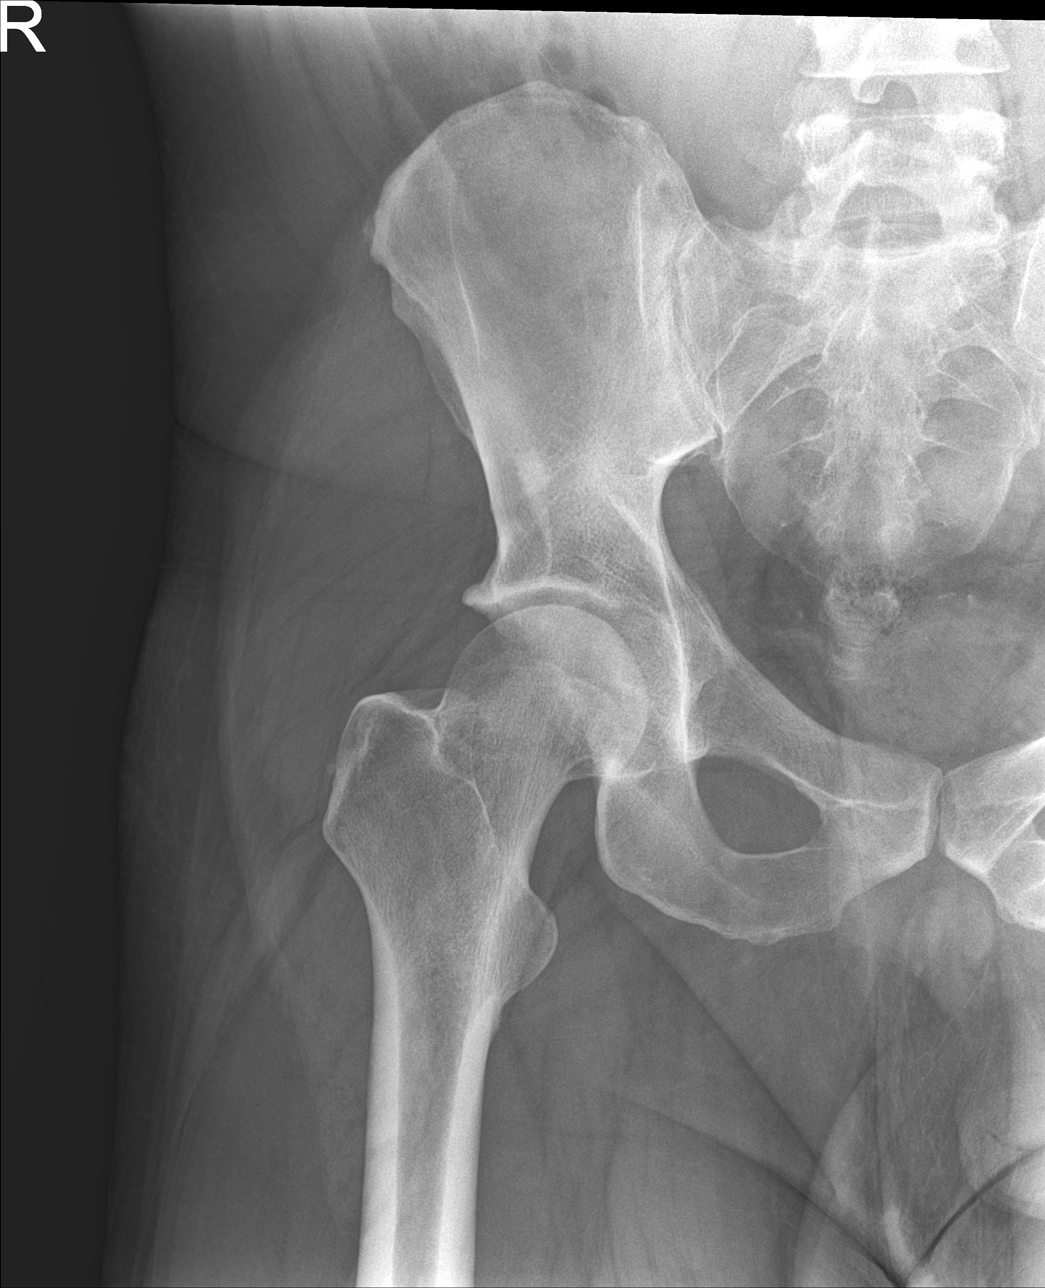

[hip lat]
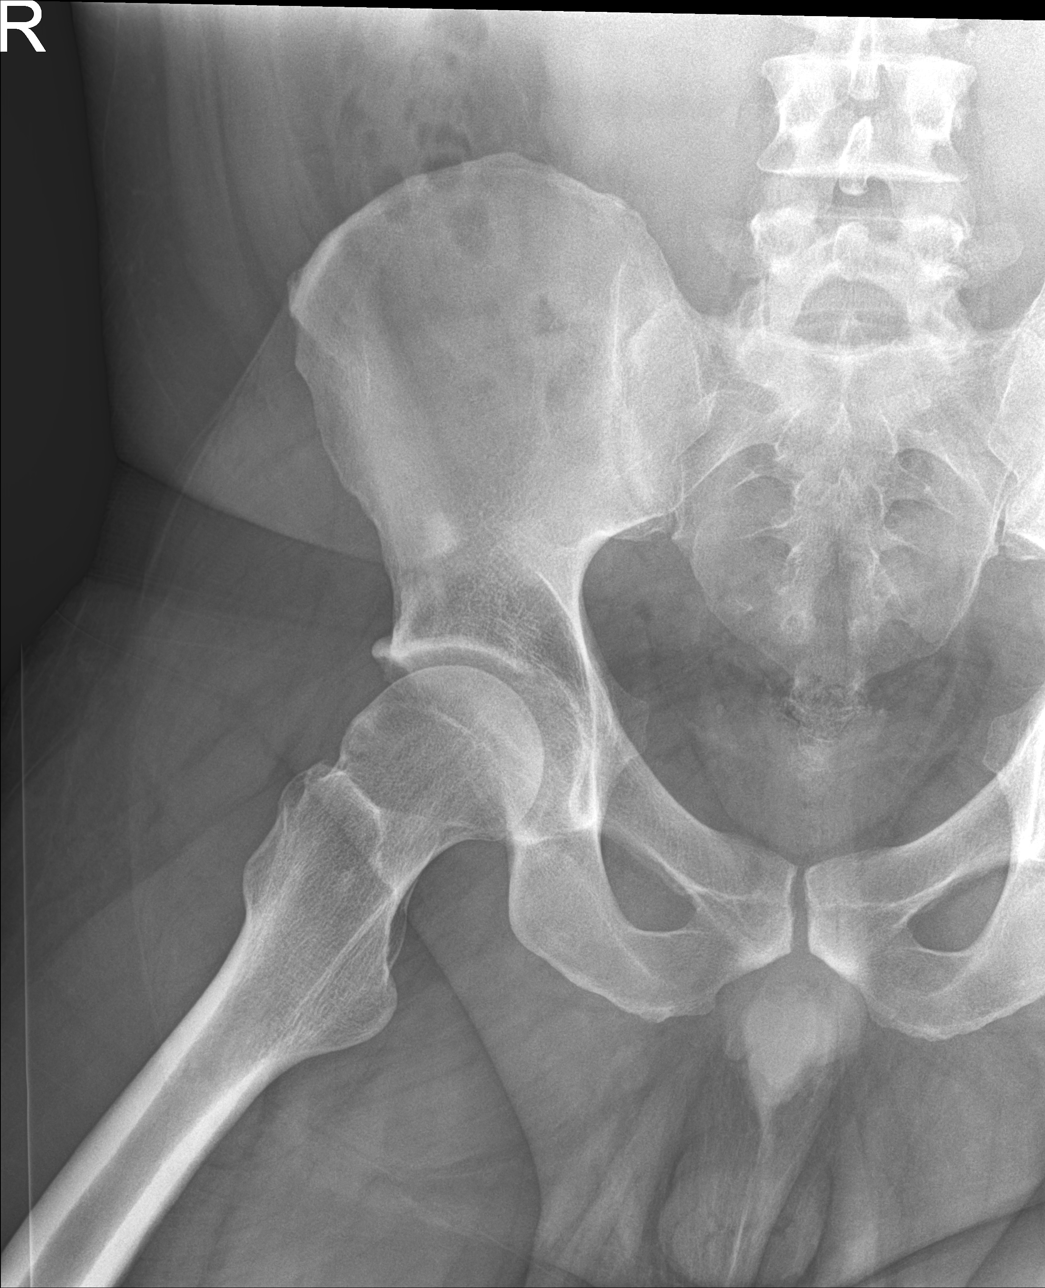

[4 of 4 positions shown; findings below may reference images not displayed]

FINDINGS: There is joint space narrowing and small superior osteophyte of the
RIGHT hip, consistent with degenerative changes. There is no acute
fracture or subluxation. Note is made of bone not within the RIGHT
iliac wing. LEFT hip is unremarkable.
IMPRESSION: Mild degenerative changes.  No evidence for acute  abnormality.

## 2021-02-12 NOTE — Patient Instructions (Signed)

## 2021-02-12 NOTE — Progress Notes (Signed)
Established Patient Office Visit  Subjective:  Patient ID: Darrell Henry, male    DOB: [birth year]  Age: 35 y.o. MRN: [id number]  CC:  Chief Complaint  Patient presents with   Flank Pain   Bloated    HPI Darrell Henry presents for abdominal pain x 1 year. The pain is primarily on his right lower side up to the RUQ. It feels like a pressure feeling. It is moderate. This occurs constantly. He feels like this has slowly gotten worse. Denies nausea, vomiting, diarrhea, or constipation. Denies changes in diet or stool. Denies correlation with diet.   He also reports that his walk has been off for about 1 year as well. He has difficulty picking up his right leg. Denies pain, numbness, or tingling in this leg. He has some occasional back pain. He also has some urinary urgency for about 2 months. Denies dysuria or frequency. He does have some hesitancy sometimes. Denies fever or injury. He has fallen a few times in the last few weeks due to his difficulty with his walk. Lumbar xray 1 year ago showed mild scoliosis and mild disc space narrowing L5-S1.   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    No past surgical history on file.  Family History  Problem Relation Age of Onset   Diabetes Mother    Hypertension Mother    Diabetes Father    Hypertension Father     Social History   Socioeconomic History   Marital status: Single    Spouse name: Not on file   Number of children: Not on file   Years of education: Not on file   Highest education level: Not on file  Occupational History   Not on file  Tobacco Use   Smoking status: Former    Years: 15.00    Pack years: 0.00    Types: Cigarettes, E-cigarettes    Quit date: 05/03/2012    Years since quitting: 8.7   Smokeless tobacco: Never   Tobacco comments:    E CIG vape    Vaping Use   Vaping Use: Every day  Substance and Sexual Activity   Alcohol use: No   Drug use: No    Types: Heroin    Comment: previous heroine addiction and narcotic addiction; Suboxone since 2013.   Sexual activity: Not on file  Other Topics Concern   Not on file  Social History Narrative   Drugs:  Heroine abuse; suboxone since 2013.     Exercise: none   Social Determinants of Health   Financial Resource Strain: Not on file  Food Insecurity: Not on file  Transportation Needs: Not on file  Physical Activity: Not on file  Stress: Not on file  Social Connections: Not on file  Intimate Partner Violence: Not on file    Outpatient Medications Prior to Visit  Medication Sig Dispense Refill   lisinopril-hydrochlorothiazide (ZESTORETIC) 10-12.5 MG tablet TAKE 1 TABLET BY MOUTH EVERY DAY 90 tablet 0   SUBOXONE 2-0.5 MG FILM Take 1 Film by mouth 2 (two) times a day.   0   No facility-administered medications prior to visit.    No Known Allergies  ROS Review of Systems As per HPI.    Objective:    Physical Exam Vitals and nursing note reviewed.  Constitutional:  General: He is not in acute distress.    Appearance: He is obese. He is not ill-appearing, toxic-appearing or diaphoretic.  Cardiovascular:     Rate and Rhythm: Normal rate and regular rhythm.     Heart sounds: Normal heart sounds. No murmur heard. Pulmonary:     Effort: Pulmonary effort is normal. No respiratory distress.     Breath sounds: Normal breath sounds.  Abdominal:     General: Bowel sounds are normal. There is no distension.     Palpations: Abdomen is soft. There is no mass.     Tenderness: There is no abdominal tenderness. There is no right CVA tenderness, left CVA tenderness, guarding or rebound.     Hernia: No hernia is present.  Musculoskeletal:     Cervical back: Normal.     Thoracic back: Normal.     Lumbar back: Normal.     Right hip: No deformity, tenderness or bony tenderness.  Decreased range of motion. Normal strength.     Right upper leg: No swelling, edema, deformity, tenderness or bony tenderness.     Right lower leg: No swelling, deformity, tenderness or bony tenderness. No edema.     Left lower leg: No edema.  Skin:    General: Skin is warm and dry.  Neurological:     General: No focal deficit present.     Mental Status: He is alert and oriented to person, place, and time.     Sensory: No sensory deficit.     Motor: No weakness.     Gait: Gait abnormal (right leg remains straightened with walking).     Deep Tendon Reflexes: Reflexes are normal and symmetric. Reflexes normal.  Psychiatric:        Behavior: Behavior normal.    BP 132/75   Pulse (!) 105   Temp 98.1 F (36.7 C) (Oral)   Ht 6' (1.829 m)   Wt (!) 305 lb 6 oz (138.5 kg)   BMI 41.42 kg/m  Wt Readings from Last 3 Encounters:  02/12/21 (!) 305 lb 6 oz (138.5 kg)  04/24/20 295 lb (133.8 kg)  02/21/20 294 lb (133.4 kg)   Urine dipstick shows negative for all components.    Health Maintenance Due  Topic Date Due   Pneumococcal Vaccine 67-56 Years old (1 - PCV) Never done    There are no preventive care reminders to display for this patient.  Lab Results  Component Value Date   TSH 2.920 02/21/2020   Lab Results  Component Value Date   WBC 8.3 04/24/2020   HGB 16.1 04/24/2020   HCT 46.5 04/24/2020   MCV 88 04/24/2020   PLT 263 04/24/2020   Lab Results  Component Value Date   NA 142 04/24/2020   K 4.1 04/24/2020   CO2 28 04/24/2020   GLUCOSE 87 04/24/2020   BUN 14 04/24/2020   CREATININE 0.93 04/24/2020   BILITOT 0.3 04/24/2020   ALKPHOS 45 (L) 04/24/2020   AST 41 (H) 04/24/2020   ALT 65 (H) 04/24/2020   PROT 7.4 04/24/2020   ALBUMIN 4.6 04/24/2020   CALCIUM 9.6 04/24/2020   ANIONGAP 6 03/02/2019   Lab Results  Component Value Date   CHOL 174 04/24/2020   Lab Results  Component Value Date   HDL 38 (L) 04/24/2020   Lab Results  Component Value Date    LDLCALC 101 (H) 04/24/2020   Lab Results  Component Value Date   TRIG 202 (H) 04/24/2020   Lab Results  Component Value Date   CHOLHDL 4.6 04/24/2020   Lab Results  Component Value Date   HGBA1C 5.6 04/24/2019      Assessment & Plan:   Nickson was seen today for flank pain and bloated.  Diagnoses and all orders for this visit:  Abdominal pain, unspecified abdominal location Right sided, chronic with worsening UA negative. Exam unremarkable. No alarm signs. Labs pending as below. History of elevated LFTs. Discussed may need RUQ ultrasound pending labs.  -     Urinalysis, Routine w reflex microscopic -     CBC with Differential/Platelet -     Hepatic function panel -     BMP8+EGFR  Elevated liver enzymes Labs pending. Discussed may need RUQ ultrasound pending labs.  -     Hepatic function panel -     HepB+HepC+HIV Panel  Decreased range of right hip movement Change in gait. Neuro exam intact. No pain. Xray today in office, radiology report pending. Will notify patient of results and determine POC pending results. Declined PT today.  -     DG Hip Unilat W OR W/O Pelvis 2-3 Views Right; Future  Follow-up: Return in about 4 weeks (around 03/12/2021) for with PCP for follow up.  The patient indicates understanding of these issues and agrees with the plan.    Gwenlyn Perking, FNP

## 2021-02-17 ENCOUNTER — Other Ambulatory Visit: Payer: Self-pay | Admitting: Family Medicine

## 2021-02-17 DIAGNOSIS — R748 Abnormal levels of other serum enzymes: Secondary | ICD-10-CM

## 2021-02-17 DIAGNOSIS — G8929 Other chronic pain: Secondary | ICD-10-CM

## 2021-02-17 LAB — CBC WITH DIFFERENTIAL/PLATELET
Basophils Absolute: 0.1 10*3/uL (ref 0.0–0.2)
Basos: 1 %
EOS (ABSOLUTE): 0.1 10*3/uL (ref 0.0–0.4)
Eos: 1 %
Hematocrit: 45.8 % (ref 37.5–51.0)
Hemoglobin: 15.2 g/dL (ref 13.0–17.7)
Immature Grans (Abs): 0 10*3/uL (ref 0.0–0.1)
Immature Granulocytes: 0 %
Lymphocytes Absolute: 2.1 10*3/uL (ref 0.7–3.1)
Lymphs: 27 %
MCH: 29.6 pg (ref 26.6–33.0)
MCHC: 33.2 g/dL (ref 31.5–35.7)
MCV: 89 fL (ref 79–97)
Monocytes Absolute: 0.6 10*3/uL (ref 0.1–0.9)
Monocytes: 8 %
Neutrophils Absolute: 4.8 10*3/uL (ref 1.4–7.0)
Neutrophils: 63 %
Platelets: 256 10*3/uL (ref 150–450)
RBC: 5.14 x10E6/uL (ref 4.14–5.80)
RDW: 13 % (ref 11.6–15.4)
WBC: 7.7 10*3/uL (ref 3.4–10.8)

## 2021-02-17 LAB — HEPATIC FUNCTION PANEL
ALT: 63 IU/L — ABNORMAL HIGH (ref 0–44)
AST: 45 IU/L — ABNORMAL HIGH (ref 0–40)
Albumin: 4.4 g/dL (ref 4.0–5.0)
Alkaline Phosphatase: 49 IU/L (ref 44–121)
Bilirubin Total: 0.3 mg/dL (ref 0.0–1.2)
Bilirubin, Direct: <0.1 mg/dL (ref 0.00–0.40)
Total Protein: 7 g/dL (ref 6.0–8.5)

## 2021-02-17 LAB — BMP8+EGFR
BUN/Creatinine Ratio: 14 (ref 9–20)
BUN: 13 mg/dL (ref 6–20)
CO2: 26 mmol/L (ref 20–29)
Calcium: 9.4 mg/dL (ref 8.7–10.2)
Chloride: 101 mmol/L (ref 96–106)
Creatinine, Ser: 0.92 mg/dL (ref 0.76–1.27)
Glucose: 84 mg/dL (ref 65–99)
Potassium: 4.1 mmol/L (ref 3.5–5.2)
Sodium: 140 mmol/L (ref 134–144)
eGFR: 111 mL/min/{1.73_m2} (ref >59)

## 2021-02-17 LAB — HEPB+HEPC+HIV PANEL
HIV Screen 4th Generation wRfx: NONREACTIVE
Hep B C IgM: NEGATIVE
Hep B Core Total Ab: NEGATIVE
Hep B E Ab: NEGATIVE
Hep B E Ag: NEGATIVE
Hep B Surface Ab, Qual: NONREACTIVE
Hep C Virus Ab: <0.1 s/co ratio (ref 0.0–0.9)
Hepatitis B Surface Ag: NEGATIVE

## 2021-02-17 NOTE — Progress Notes (Signed)
Pt wants test results

## 2021-02-24 ENCOUNTER — Telehealth: Payer: Self-pay | Admitting: Family Medicine

## 2021-02-24 NOTE — Telephone Encounter (Signed)
Patient is going to cancel appt until after he has the abdomen U/S.  This is scheduled for 7/21 10:15 at [facility].  Will see Dr. Louanne Skye on Aug 5th.

## 2021-02-25 ENCOUNTER — Ambulatory Visit: Payer: No Typology Code available for payment source | Admitting: Family Medicine

## 2021-02-26 ENCOUNTER — Ambulatory Visit: Payer: Self-pay | Admitting: Family Medicine

## 2021-03-04 ENCOUNTER — Ambulatory Visit (HOSPITAL_COMMUNITY)
Admission: RE | Admit: 2021-03-04 | Discharge: 2021-03-04 | Disposition: A | Discharge status: Home / Self Care | Payer: 59 | Source: Ambulatory Visit | Attending: Family Medicine | Admitting: Family Medicine

## 2021-03-04 ENCOUNTER — Other Ambulatory Visit: Payer: Self-pay

## 2021-03-04 DIAGNOSIS — G8929 Other chronic pain: Secondary | ICD-10-CM | POA: Insufficient documentation

## 2021-03-04 DIAGNOSIS — R1011 Right upper quadrant pain: Secondary | ICD-10-CM | POA: Insufficient documentation

## 2021-03-04 DIAGNOSIS — R748 Abnormal levels of other serum enzymes: Secondary | ICD-10-CM | POA: Insufficient documentation

## 2021-03-04 IMAGING — US US ABDOMEN LIMITED
1 series · 14 of 25 positions shown · non-contrast
Comparison: None.

CLINICAL DATA: Elevated liver function tests

Chronic right upper quadrant pain
Polysubstance abuse
EXAM:
ULTRASOUND ABDOMEN LIMITED RIGHT UPPER QUADRANT

[Series 1: us abdomen limited · 0.27mm/px · 14 of 72 slices shown]
[im 1/72]
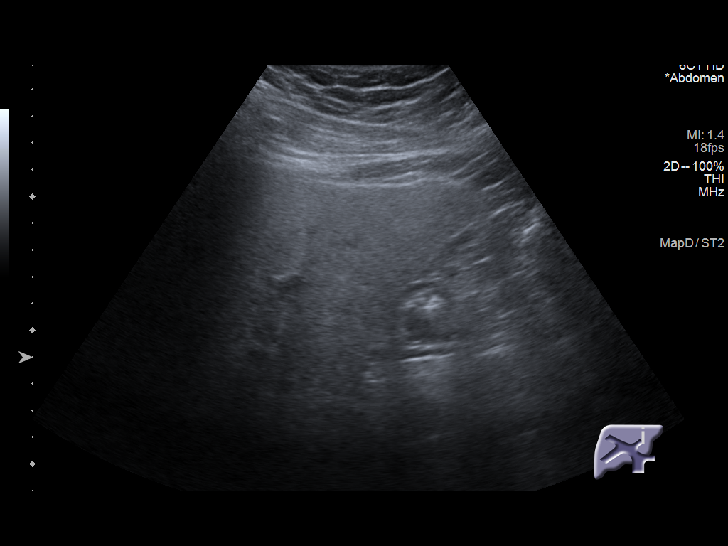
[im 6/72]
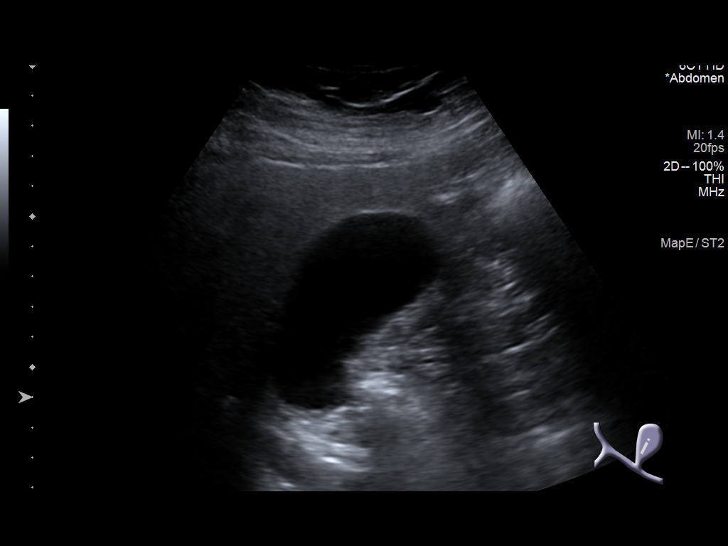
[im 12/72]
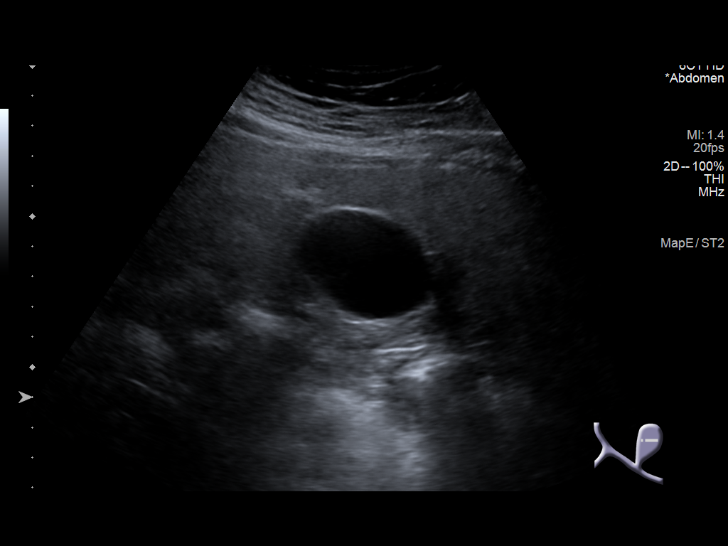
[im 18/72]
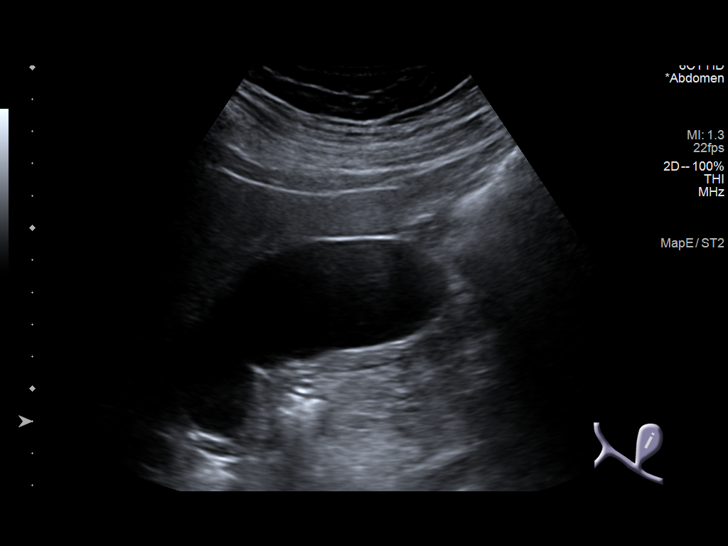
[im 24/72]
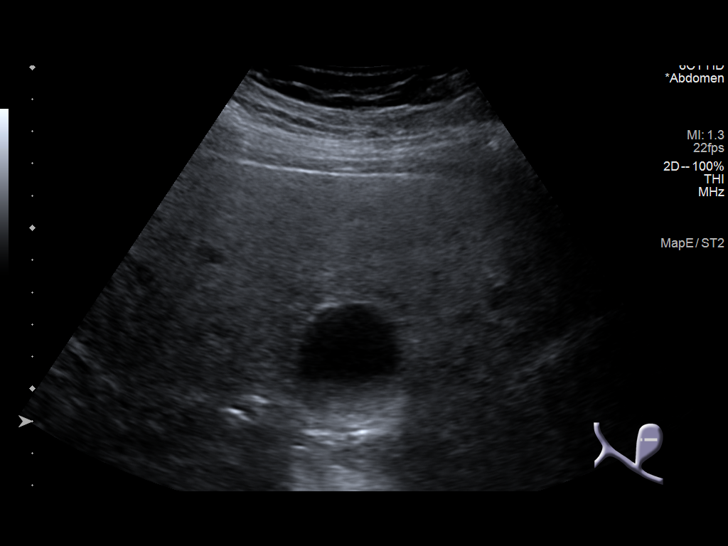
[im 27/72]
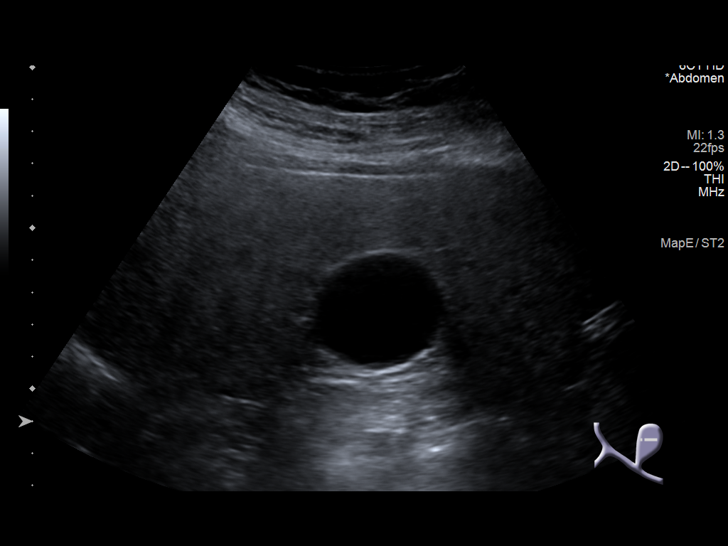
[im 33/72]
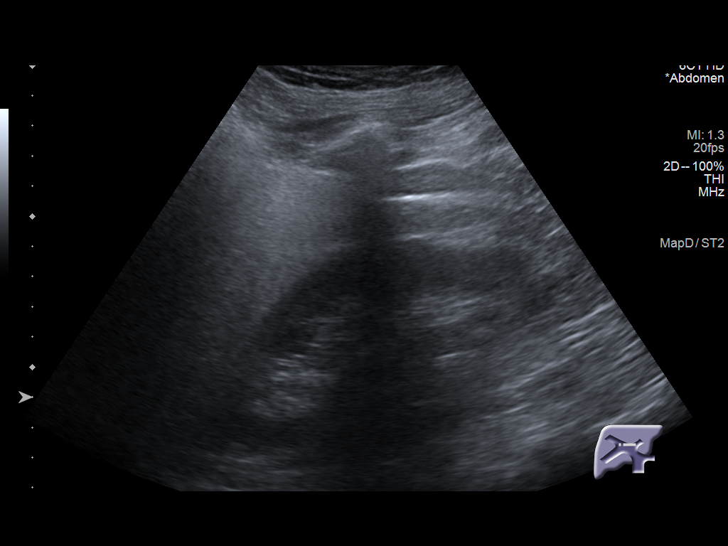
[im 39/72]
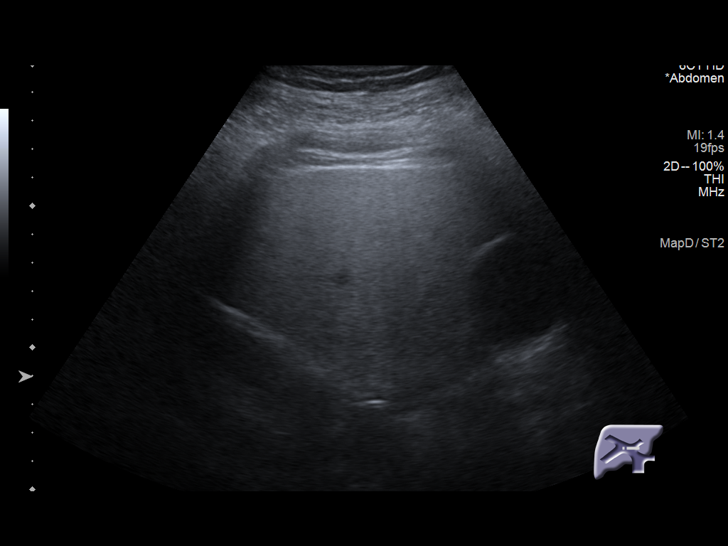
[im 45/72]
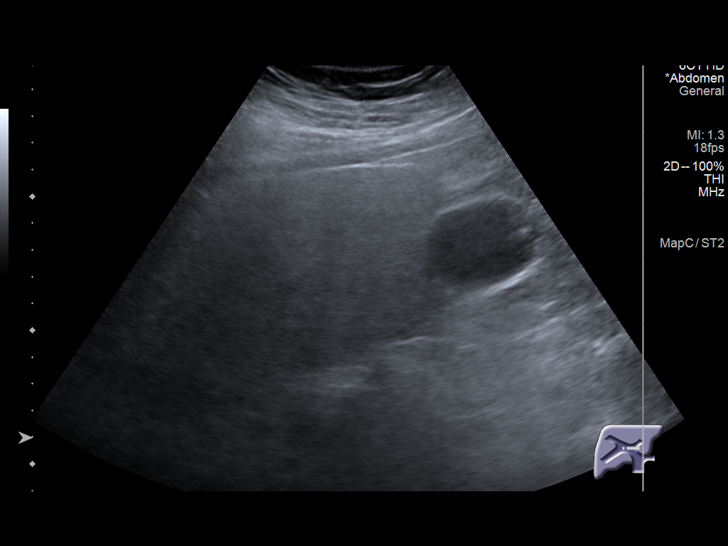
[im 48/72]
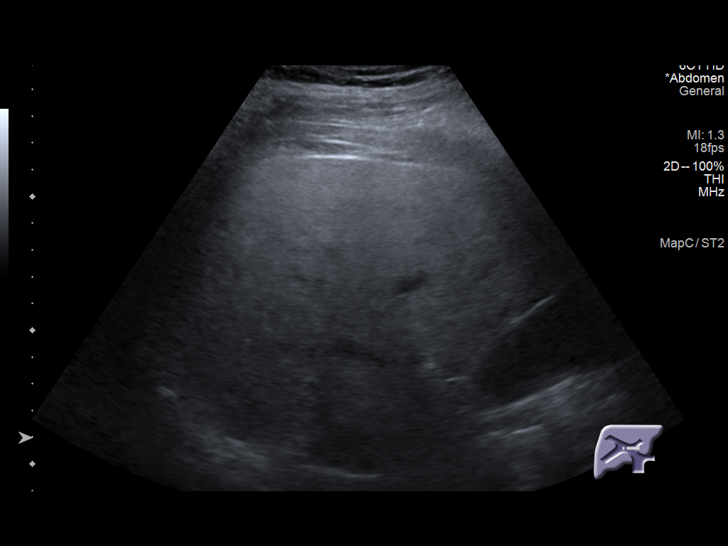
[im 54/72]
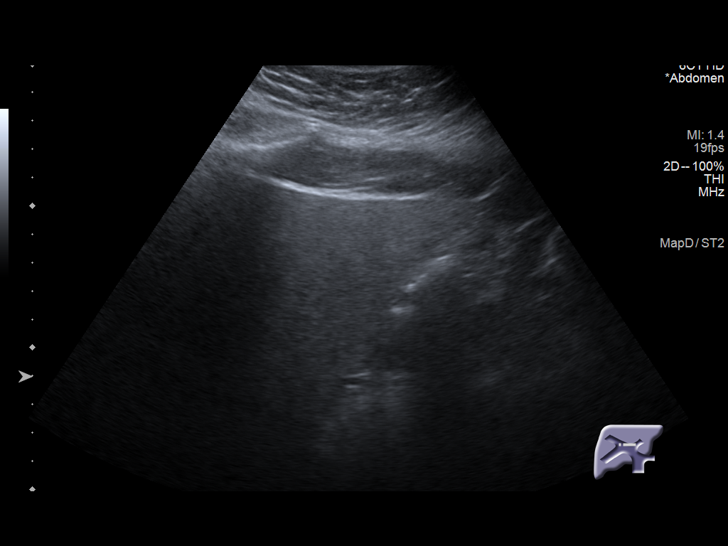
[im 60/72]
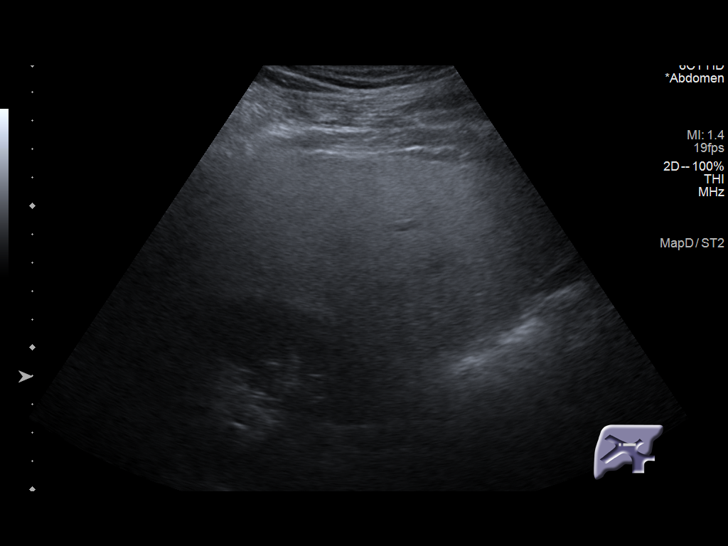
[im 66/72]
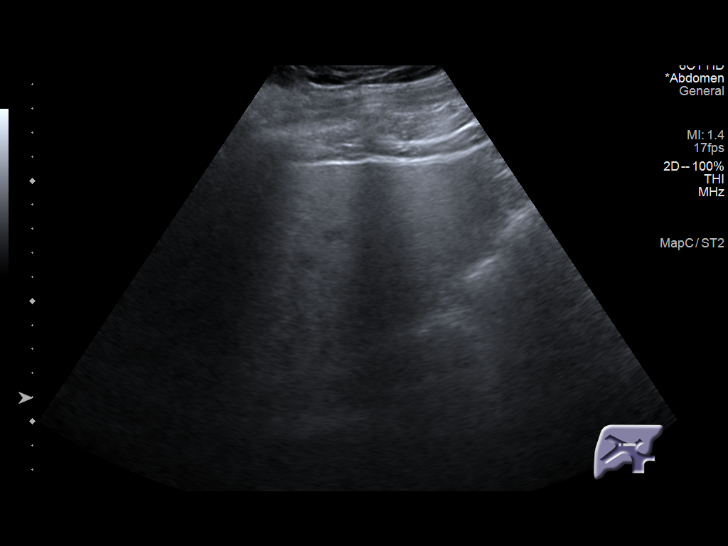
[im 72/72]
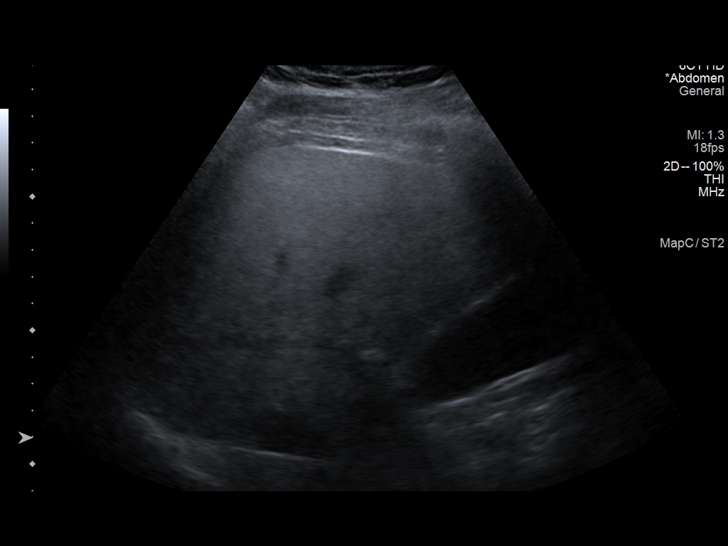

[14 of 25 positions shown; findings below may reference images not displayed]

FINDINGS: Gallbladder:

No gallstones or wall thickening visualized. No sonographic Murphy
sign noted by sonographer.

Common bile duct:

Diameter: 3 mm

Liver:

No focal lesion.

Diffusely increased parenchymal echogenicity.

Portal vein is patent on color Doppler imaging with normal direction
of blood flow towards the liver.

Other: None.
IMPRESSION: Diffuse increased echogenicity of the hepatic parenchyma is a
nonspecific indicator of hepatocellular dysfunction, most commonly
steatosis.

## 2021-03-08 ENCOUNTER — Telehealth: Payer: Self-pay | Admitting: Family Medicine

## 2021-03-08 NOTE — Telephone Encounter (Signed)
Results have not been reviewed by provider. Informed pt that we would call once reviewed.

## 2021-03-19 ENCOUNTER — Ambulatory Visit (INDEPENDENT_AMBULATORY_CARE_PROVIDER_SITE_OTHER): Payer: PRIVATE HEALTH INSURANCE

## 2021-03-19 ENCOUNTER — Encounter: Payer: Self-pay | Admitting: Family Medicine

## 2021-03-19 ENCOUNTER — Ambulatory Visit (INDEPENDENT_AMBULATORY_CARE_PROVIDER_SITE_OTHER): Payer: PRIVATE HEALTH INSURANCE | Admitting: Family Medicine

## 2021-03-19 ENCOUNTER — Other Ambulatory Visit: Payer: Self-pay

## 2021-03-19 VITALS — BP 127/74 | HR 97 | Wt 305.0 lb

## 2021-03-19 DIAGNOSIS — R269 Unspecified abnormalities of gait and mobility: Secondary | ICD-10-CM

## 2021-03-19 DIAGNOSIS — M62838 Other muscle spasm: Secondary | ICD-10-CM | POA: Diagnosis not present

## 2021-03-19 DIAGNOSIS — K760 Fatty (change of) liver, not elsewhere classified: Secondary | ICD-10-CM | POA: Diagnosis not present

## 2021-03-19 IMAGING — DX DG LUMBAR SPINE 2-3V
3 series · 3 of 3 positions shown · non-contrast
Comparison: Radiograph [DATE]

CLINICAL DATA: Back issues, gait instability, history of scoliosis
reported by patient

EXAM:
LUMBAR SPINE - 2-3 VIEW

[l-spine ap (1 of 2)]
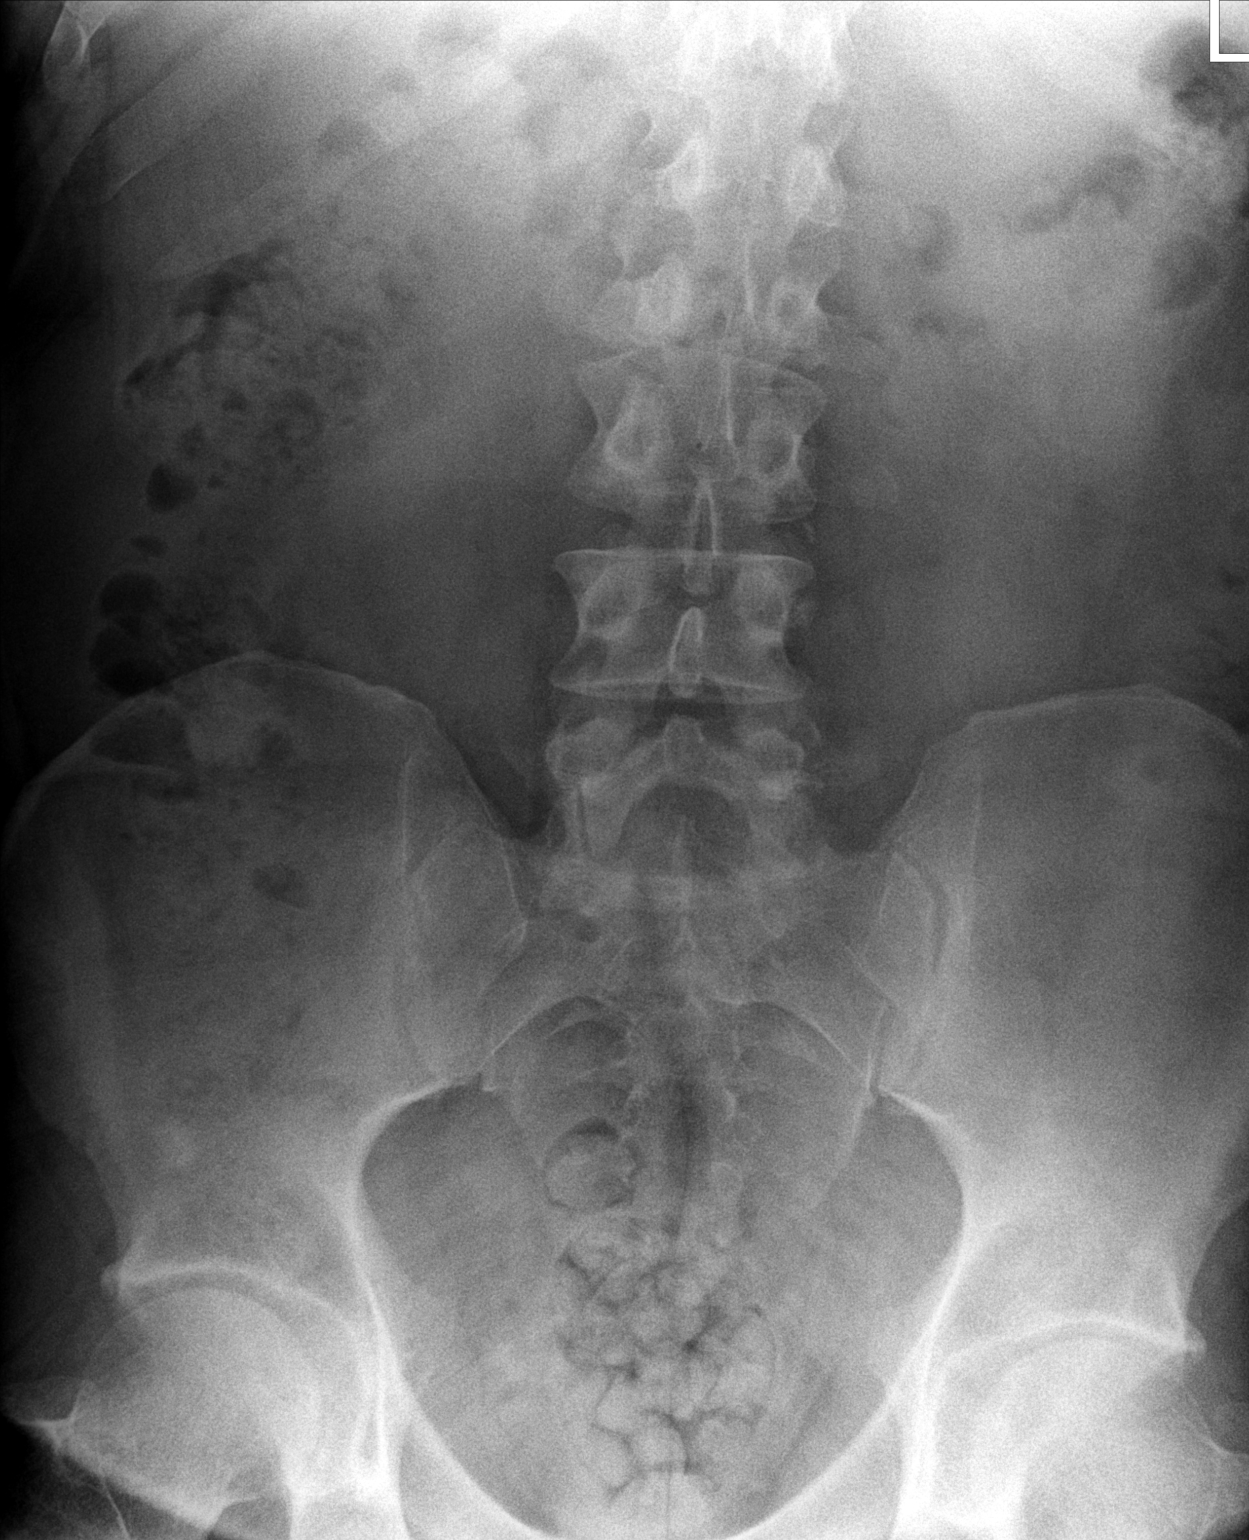

[l-spine lat]
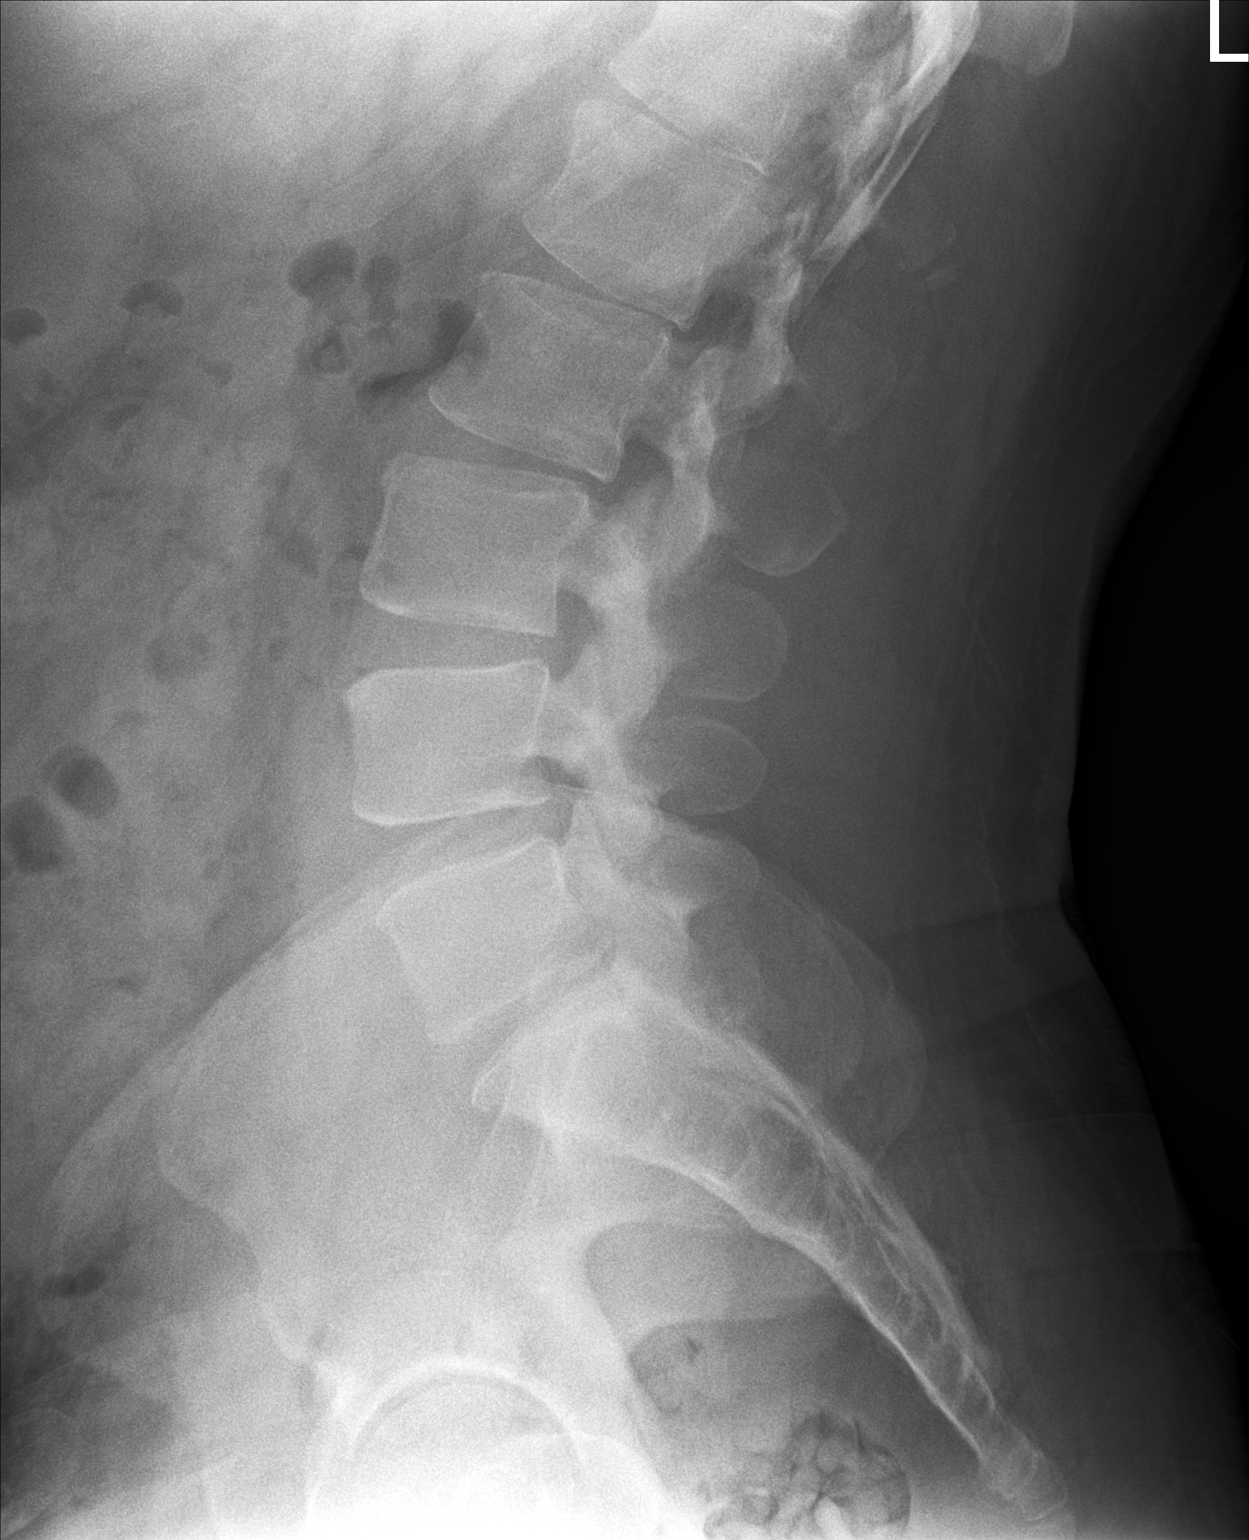

[l-spine ap (2 of 2)]
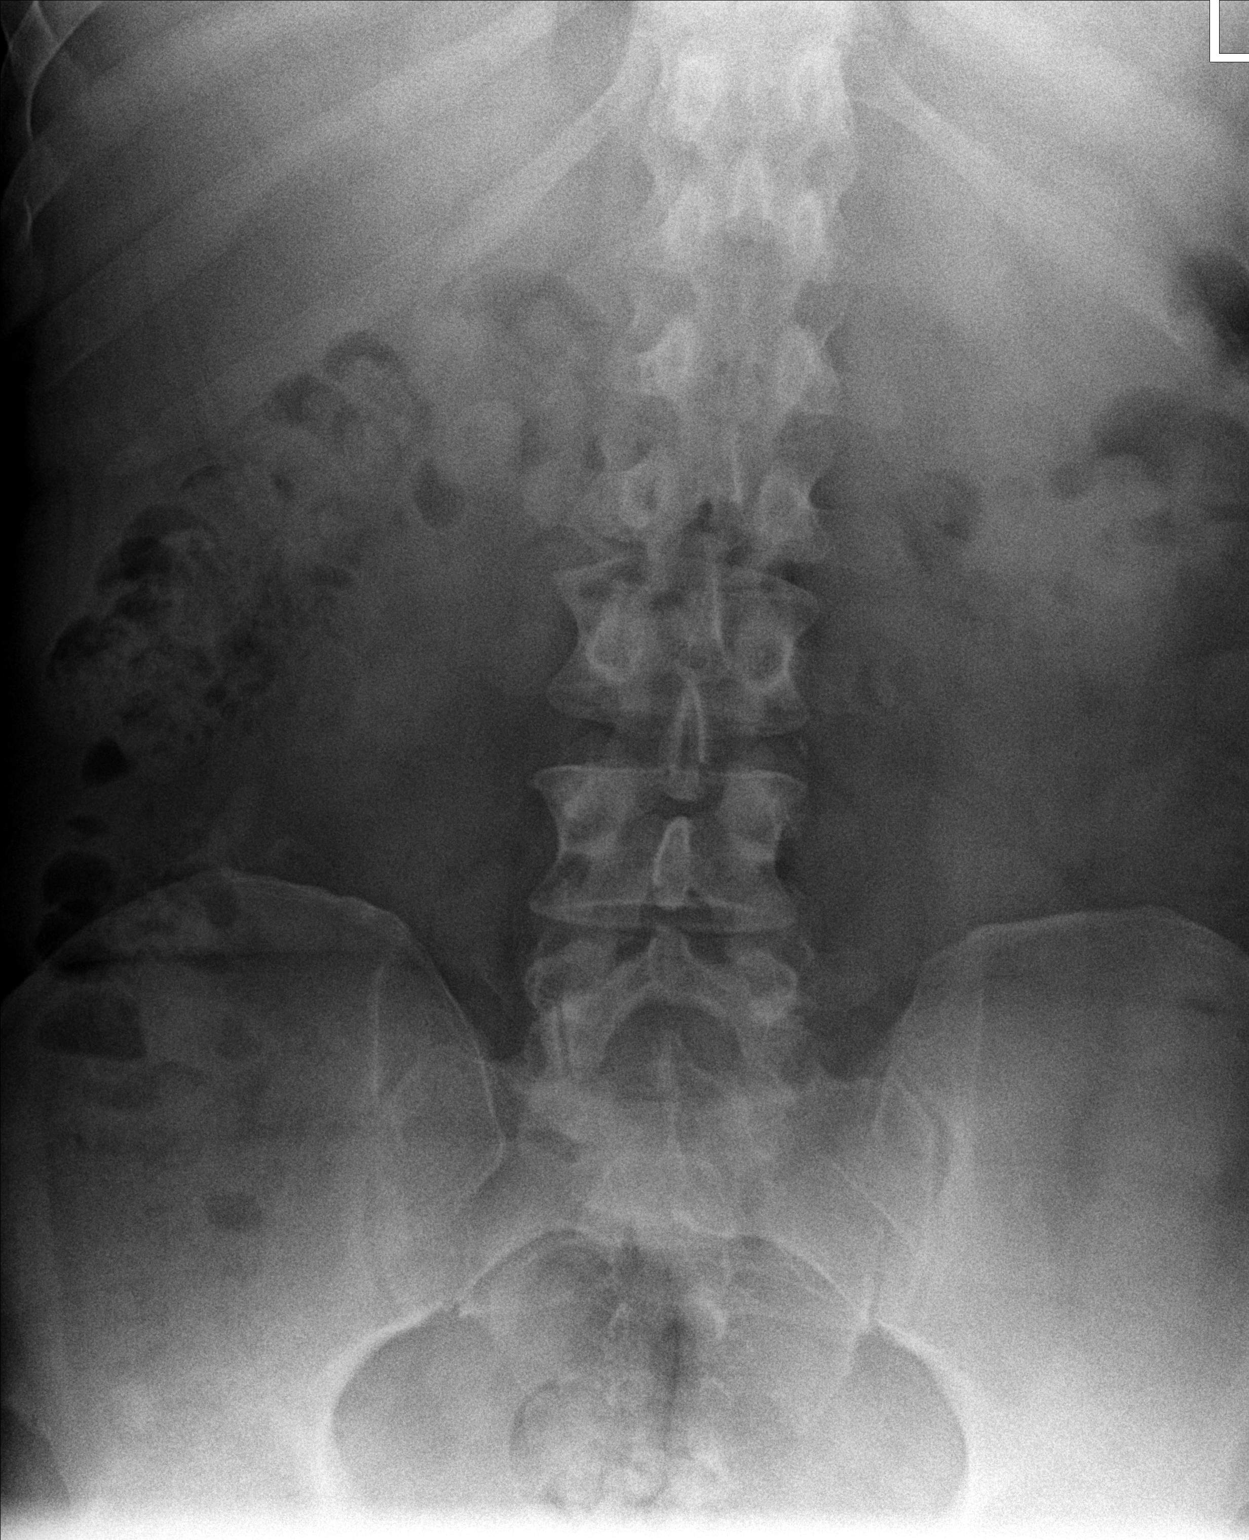

[3 of 3 positions shown; findings below may reference images not displayed]

FINDINGS: Five lumbar vertebrae. Levocurvature of the thoracolumbar spine,
incompletely included within the margin of imaging though grossly
similar to comparison prior. Stepwise retrolisthesis L1-L3. No other
significant spondylolisthesis. No abnormally widened, jumped or
perched facets. No vertebral body fracture or height loss is seen.
Multilevel intervertebral disc height loss, maximal L2-3, L5-S1 with
corresponding discogenic and facet degenerative changes. Normal bone
mineralization. No worrisome osseous lesions.

Additional degenerative changes noted in the bilateral hips.

Soft tissues are noncontributory.
IMPRESSION: No acute osseous abnormality.

Minimal dextro thoracic levocurvature, incompletely assessed.

Discogenic and facet degenerative changes, maximal at L2-L3 and
L5-S1 though overall similar to comparison prior.

## 2021-03-19 MED ORDER — PREDNISONE 20 MG PO TABS: ORAL_TABLET | ORAL | 0 refills | Status: DC

## 2021-03-19 NOTE — Progress Notes (Signed)
BP 127/74   Pulse 97   Wt (!) 305 lb (138.3 kg)   SpO2 99%   BMI 41.37 kg/m    Subjective:   Patient ID: Darrell Henry, male    DOB: Jan 04, 1986, 35 y.o.   MRN: [id number]  HPI: Darrell Henry is a 35 y.o. male presenting on 03/19/2021 for Discuss ultrasound (Fatty Liver)   HPI Patient comes in for recheck on ultrasound, we discussed that the ultrasound showed diffuse echogenicity which is likely he had a steatosis.  Patient is understanding and will focus on diet and lifestyle modification.  Patient is also coming in today complaining of difficulty walking and spasms and twitches in his right lower extremity when he walks.  He denies any significant back pain but just has the difficulty walking.  Patient says this is been going on for few months but is worsened recently.  He has been diagnosed with scoliosis in the back but that has not really issue for him until this difficulty walking.  He says even going around the grocery store he gets some much twitching and spasm in his leg that he has to use a cart to  Relevant past medical, surgical, family and social history reviewed and updated as indicated. Interim medical history since our last visit reviewed. Allergies and medications reviewed and updated.  Review of Systems  Constitutional:  Negative for chills and fever.  Eyes:  Negative for visual disturbance.  Respiratory:  Negative for shortness of breath and wheezing.   Cardiovascular:  Negative for chest pain and leg swelling.  Musculoskeletal:  Positive for gait problem and myalgias. Negative for back pain.  Skin:  Negative for rash.  All other systems reviewed and are negative.  Per HPI unless specifically indicated above   Allergies as of 03/19/2021   No Known Allergies      Medication List        Accurate as of March 19, 2021  1:29 PM. If you have any questions, ask your nurse or doctor.          lisinopril-hydrochlorothiazide 10-12.5 MG tablet Commonly known  as: ZESTORETIC TAKE 1 TABLET BY MOUTH EVERY DAY   predniSONE 20 MG tablet Commonly known as: DELTASONE 2 po at same time daily for 5 days Started by: Elige Radon Yalanda Soderman, MD   Suboxone 2-0.5 MG Film Generic drug: Buprenorphine HCl-Naloxone HCl Take 1 Film by mouth 2 (two) times a day.         Objective:   BP 127/74   Pulse 97   Wt (!) 305 lb (138.3 kg)   SpO2 99%   BMI 41.37 kg/m   Wt Readings from Last 3 Encounters:  03/19/21 (!) 305 lb (138.3 kg)  02/12/21 (!) 305 lb 6 oz (138.5 kg)  04/24/20 295 lb (133.8 kg)    Physical Exam Vitals and nursing note reviewed.  Constitutional:      General: He is not in acute distress.    Appearance: He is well-developed. He is not diaphoretic.  Eyes:     General: No scleral icterus.    Conjunctiva/sclera: Conjunctivae normal.  Neck:     Thyroid: No thyromegaly.  Musculoskeletal:        General: No tenderness. Normal range of motion.     Cervical back: Neck supple.     Lumbar back: No deformity or tenderness. Normal range of motion. Negative right straight leg raise test and negative left straight leg raise test.     Comments: Muscle spasms when  walking and right leg  Lymphadenopathy:     Cervical: No cervical adenopathy.  Skin:    General: Skin is warm and dry.     Findings: No rash.  Neurological:     Mental Status: He is alert and oriented to person, place, and time.     Coordination: Coordination normal.  Psychiatric:        Behavior: Behavior normal.      Assessment & Plan:   Problem List Items Addressed This Visit   None Visit Diagnoses     Gait difficulty    -  Primary   Relevant Orders   Ambulatory referral to Neurology   DG Lumbar Spine 2-3 Views   Muscle spasm of right lower extremity       Relevant Orders   Ambulatory referral to Neurology   DG Lumbar Spine 2-3 Views   Hepatic steatosis           Gave reassurance for fatty liver, will monitor liver function in the future yearly labs  With  neurologic issues, will do x-ray and prednisone and referral to neurology Follow up plan: Return if symptoms worsen or fail to improve.  Counseling provided for all of the vaccine components Orders Placed This Encounter  Procedures   DG Lumbar Spine 2-3 Views   Ambulatory referral to Neurology    Arville Care, MD [facility] 03/19/2021, 1:29 PM

## 2021-05-31 ENCOUNTER — Ambulatory Visit (INDEPENDENT_AMBULATORY_CARE_PROVIDER_SITE_OTHER): Payer: 59 | Admitting: Diagnostic Neuroimaging

## 2021-05-31 ENCOUNTER — Encounter: Payer: Self-pay | Admitting: Diagnostic Neuroimaging

## 2021-05-31 VITALS — BP 127/76 | HR 88 | Ht 72.0 in | Wt 305.6 lb

## 2021-05-31 DIAGNOSIS — R200 Anesthesia of skin: Secondary | ICD-10-CM | POA: Diagnosis not present

## 2021-05-31 DIAGNOSIS — R252 Cramp and spasm: Secondary | ICD-10-CM

## 2021-05-31 DIAGNOSIS — R29898 Other symptoms and signs involving the musculoskeletal system: Secondary | ICD-10-CM | POA: Diagnosis not present

## 2021-05-31 DIAGNOSIS — R292 Abnormal reflex: Secondary | ICD-10-CM | POA: Diagnosis not present

## 2021-05-31 DIAGNOSIS — G959 Disease of spinal cord, unspecified: Secondary | ICD-10-CM

## 2021-05-31 DIAGNOSIS — M542 Cervicalgia: Secondary | ICD-10-CM

## 2021-05-31 NOTE — Patient Instructions (Signed)
-   check MRI brain and cervical spine 

## 2021-05-31 NOTE — Progress Notes (Signed)
[facility]  PATIENT: Darrell Henry DOB: 08/29/1985  REFERRING CLINICIAN: Dettinger, Elige Radon, MD HISTORY FROM: patient  REASON FOR VISIT: new consult    HISTORICAL  CHIEF COMPLAINT:  Chief Complaint  Patient presents with   Gait Problem    Rm 7 New Pt  fiancee- Victorino Dike  "my hobby is [employer]-none in past year; gait issues x 1 year"     HISTORY OF PRESENT ILLNESS:   35 year old male here for evaluation of right leg weakness.  For past 1 year patient is a gradual onset progressive right leg weakness, stiffness, spasms in the right foot and leg.  Also has some numbness in bilateral hands digits 1 5.  Also has some neck pain, low back pain, speech is stuttering difficulties, ataxic and drunk feeling gait, and abnormal sensation in his right thoracic region.  Symptoms fluctuate over time.  Sometimes he has flareup of symptoms lasting 2 to 3 days at a time.  No specific prodromal accidents injuries or traumas.  He does do [employer] as a hobby and has had some chronic pain issues related to this.  Patient is planning to move to [place] within the next few weeks.  He would like to get work-up done before moving there.   REVIEW OF SYSTEMS: Full 14 system review of systems performed and negative with exception of: as per HPI.  ALLERGIES: No Known Allergies  HOME MEDICATIONS: Outpatient Medications Prior to Visit  Medication Sig Dispense Refill   lisinopril-hydrochlorothiazide (ZESTORETIC) 10-12.5 MG tablet TAKE 1 TABLET BY MOUTH EVERY DAY 90 tablet 0   SUBOXONE 2-0.5 MG FILM Take 1 Film by mouth 2 (two) times a day.   0   predniSONE (DELTASONE) 20 MG tablet 2 po at same time daily for 5 days 10 tablet 0   No facility-administered medications prior to visit.    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    PAST SURGICAL HISTORY: History reviewed. No pertinent surgical history.  FAMILY HISTORY: Family History  Problem Relation Age of Onset   Diabetes Mother    Hypertension Mother    Diabetes Father    Hypertension Father     SOCIAL HISTORY: Social History   Socioeconomic History   Marital status: Single    Spouse name: Not on file   Number of children: 0   Years of education: Not on file   Highest education level: Associate degree: occupational, Scientist, product/process development, or vocational program  Occupational History    Comment: employed  Tobacco Use   Smoking status: Former    Years: 15.00    Types: Cigarettes, E-cigarettes    Quit date: 05/03/2012    Years since quitting: 9.0   Smokeless tobacco: Never   Tobacco comments:    05/31/21 E CIG vape   Vaping Use   Vaping Use: Every day  Substance and Sexual Activity   Alcohol use: No   Drug use: No    Types: Heroin    Comment: previous heroine addiction and narcotic addiction; Suboxone since 2013.   Sexual activity: Not on file  Other Topics Concern   Not on file  Social History Narrative   Drugs:  Hx of Heroine abuse; suboxone since 2013.  Exercise: none   Social Determinants of Health   Financial Resource Strain: Not  on file  Food Insecurity: Not on file  Transportation Needs: Not on file  Physical Activity: Not on file  Stress: Not on file  Social Connections: Not on file  Intimate Partner Violence: Not on file     PHYSICAL EXAM  GENERAL EXAM/CONSTITUTIONAL: Vitals:  Vitals:   05/31/21 0938  BP: 127/76  Pulse: 88  Weight: (!) 305 lb 9.6 oz (138.6 kg)  Height: 6' (1.829 m)   Body mass index is 41.45 kg/m. Wt Readings from Last 3 Encounters:  05/31/21 (!) 305 lb 9.6 oz (138.6 kg)  03/19/21 (!) 305 lb (138.3 kg)  02/12/21 (!) 305 lb 6 oz (138.5 kg)   Patient is in no distress; well developed, nourished and groomed; neck is supple  CARDIOVASCULAR: Examination of  carotid arteries is normal; no carotid bruits Regular rate and rhythm, no murmurs Examination of peripheral vascular system by observation and palpation is normal  EYES: Ophthalmoscopic exam of optic discs and posterior segments is normal; no papilledema or hemorrhages No results found.  MUSCULOSKELETAL: Gait, strength, tone, movements noted in Neurologic exam below  NEUROLOGIC: MENTAL STATUS:  No flowsheet data found. awake, alert, oriented to person, place and time recent and remote memory intact normal attention and concentration language fluent, comprehension intact, naming intact fund of knowledge appropriate  CRANIAL NERVE:  2nd - no papilledema on fundoscopic exam 2nd, 3rd, 4th, 6th - pupils equal and reactive to light, visual fields full to confrontation, extraocular muscles intact, no nystagmus 5th - facial sensation symmetric 7th - facial strength symmetric 8th - hearing intact 9th - palate elevates symmetrically, uvula midline 11th - shoulder shrug symmetric 12th - tongue protrusion midline  MOTOR:  normal bulk and tone, full strength in the BUE, BLE; EXCEPT INCREASE TONE IN RIGHT LOWER EXT AT KNEE; SUSTAINED CLONUS IN RIGHT ANKLES; 5-6 BEATS CLONUS IN LEFT ANKLES  SENSORY:  normal and symmetric to light touch, pinprick, temperature, vibration; EXCEPT SLIGHTLY REDUCES IN BILATERAL FEET  COORDINATION:  finger-nose-finger, fine finger movements --> SLIGHT DYSMETRIA IN BUE  REFLEXES:  deep tendon reflexes -> BUE 2+; KNEES 3; ANKLES 2+ CLONUS IN RIGHT > LET ANKLES POSITIVE HOFFMANS IN BUE MUTE TOES ON PLANTAR STIM POSITIVE SUPRAPATELLAR REFLEXES NEG CROSS ADDUCTORS  GAIT/STATION:  SPASTIC GAIT (RIGHT LEG WORSE THAN LEFT)     DIAGNOSTIC DATA (LABS, IMAGING, TESTING) - I reviewed patient records, labs, notes, testing and imaging myself where available.  Lab Results  Component Value Date   WBC 7.7 02/12/2021   HGB 15.2 02/12/2021   HCT 45.8 02/12/2021    MCV 89 02/12/2021   PLT 256 02/12/2021      Component Value Date/Time   NA 140 02/12/2021 1132   K 4.1 02/12/2021 1132   CL 101 02/12/2021 1132   CO2 26 02/12/2021 1132   GLUCOSE 84 02/12/2021 1132   GLUCOSE 149 (H) 03/02/2019 1848   BUN 13 02/12/2021 1132   CREATININE 0.92 02/12/2021 1132   CALCIUM 9.4 02/12/2021 1132   PROT 7.0 02/12/2021 1132   ALBUMIN 4.4 02/12/2021 1132   AST 45 (H) 02/12/2021 1132   ALT 63 (H) 02/12/2021 1132   ALKPHOS 49 02/12/2021 1132   BILITOT 0.3 02/12/2021 1132   GFRNONAA 106 04/24/2020 1446   GFRAA 123 04/24/2020 1446   Lab Results  Component Value Date   CHOL 174 04/24/2020   HDL 38 (L) 04/24/2020   LDLCALC 101 (H) 04/24/2020   LDLDIRECT 91 04/04/2017   TRIG 202 (H) 04/24/2020   CHOLHDL  4.6 04/24/2020   Lab Results  Component Value Date   HGBA1C 5.6 04/24/2019   Lab Results  Component Value Date   VITAMINB12 670 02/21/2020   Lab Results  Component Value Date   TSH 2.920 02/21/2020    03/19/21 Xray lumbar - No acute osseous abnormality. - Minimal dextro thoracic levocurvature, incompletely assessed. - Discogenic and facet degenerative changes, maximal at L2-L3 and L5-S1 though overall similar to comparison prior.    ASSESSMENT AND PLAN  35 y.o. year old male here with 1 year of progressive right leg weakness, bilateral hand numbness, spastic gait, hyperreflexia, stuttering speech, concerning for central nervous system autoimmune or inflammatory process.  We will proceed with further work-up.   Dx:  1. Right leg weakness   2. Spasticity   3. Bilateral hand numbness   4. Hyperreflexia   5. Neck pain       PLAN:  - check MRI brain and cervical spine (rule out multiple sclerosis)  Orders Placed This Encounter  Procedures   MR BRAIN W WO CONTRAST   MR CERVICAL SPINE W WO CONTRAST   Return for pending if symptoms worsen or fail to improve, pending test results.    Suanne Marker, MD 05/31/2021, 10:44  AM Certified in Neurology, Neurophysiology and Neuroimaging  [facility] [address], [address] [place], [place] [address] [phone]

## 2021-06-03 ENCOUNTER — Telehealth: Payer: Self-pay | Admitting: Diagnostic Neuroimaging

## 2021-06-03 NOTE — Telephone Encounter (Signed)
Case started with [employer] for MRI brain w/wo contrast & MRI cervical spine w/wo contrast. Submitted request via fax form. Pending review. Phone: [phone].

## 2021-06-07 NOTE — Telephone Encounter (Signed)
Received approval. PA #[id number] (06/03/21- 09/03/21). Orders faxed to [facility] radiology for scheduling @ [facility].

## 2021-06-08 ENCOUNTER — Other Ambulatory Visit: Payer: Self-pay | Admitting: Diagnostic Neuroimaging

## 2021-06-08 DIAGNOSIS — S00259A Superficial foreign body of unspecified eyelid and periocular area, initial encounter: Secondary | ICD-10-CM

## 2021-06-10 ENCOUNTER — Other Ambulatory Visit (HOSPITAL_COMMUNITY): Payer: Self-pay | Admitting: Diagnostic Neuroimaging

## 2021-06-10 DIAGNOSIS — S00259A Superficial foreign body of unspecified eyelid and periocular area, initial encounter: Secondary | ICD-10-CM

## 2021-06-11 ENCOUNTER — Ambulatory Visit: Payer: 59 | Admitting: Diagnostic Neuroimaging

## 2021-06-18 ENCOUNTER — Ambulatory Visit (HOSPITAL_COMMUNITY): Payer: 59

## 2021-06-18 ENCOUNTER — Other Ambulatory Visit (HOSPITAL_COMMUNITY): Payer: 59

## 2021-06-25 ENCOUNTER — Ambulatory Visit (HOSPITAL_COMMUNITY)
Admission: RE | Admit: 2021-06-25 | Discharge: 2021-06-25 | Disposition: A | Discharge status: Home / Self Care | Payer: 59 | Source: Ambulatory Visit | Attending: Diagnostic Neuroimaging | Admitting: Diagnostic Neuroimaging

## 2021-06-25 ENCOUNTER — Other Ambulatory Visit: Payer: Self-pay

## 2021-06-25 ENCOUNTER — Other Ambulatory Visit: Payer: 59

## 2021-06-25 ENCOUNTER — Telehealth: Payer: Self-pay | Admitting: Neurology

## 2021-06-25 DIAGNOSIS — M542 Cervicalgia: Secondary | ICD-10-CM

## 2021-06-25 DIAGNOSIS — R200 Anesthesia of skin: Secondary | ICD-10-CM

## 2021-06-25 DIAGNOSIS — R292 Abnormal reflex: Secondary | ICD-10-CM | POA: Insufficient documentation

## 2021-06-25 DIAGNOSIS — S00259A Superficial foreign body of unspecified eyelid and periocular area, initial encounter: Secondary | ICD-10-CM

## 2021-06-25 DIAGNOSIS — R252 Cramp and spasm: Secondary | ICD-10-CM | POA: Insufficient documentation

## 2021-06-25 DIAGNOSIS — R29898 Other symptoms and signs involving the musculoskeletal system: Secondary | ICD-10-CM | POA: Diagnosis not present

## 2021-06-25 IMAGING — DX DG ORBITS FOR FOREIGN BODY
2 series · 2 of 2 positions shown · non-contrast
Comparison: None.

CLINICAL DATA: Metal working/exposure; clearance prior to MRI

EXAM:
ORBITS FOR FOREIGN BODY - 2 VIEW

[orbits waters (1 of 2)]
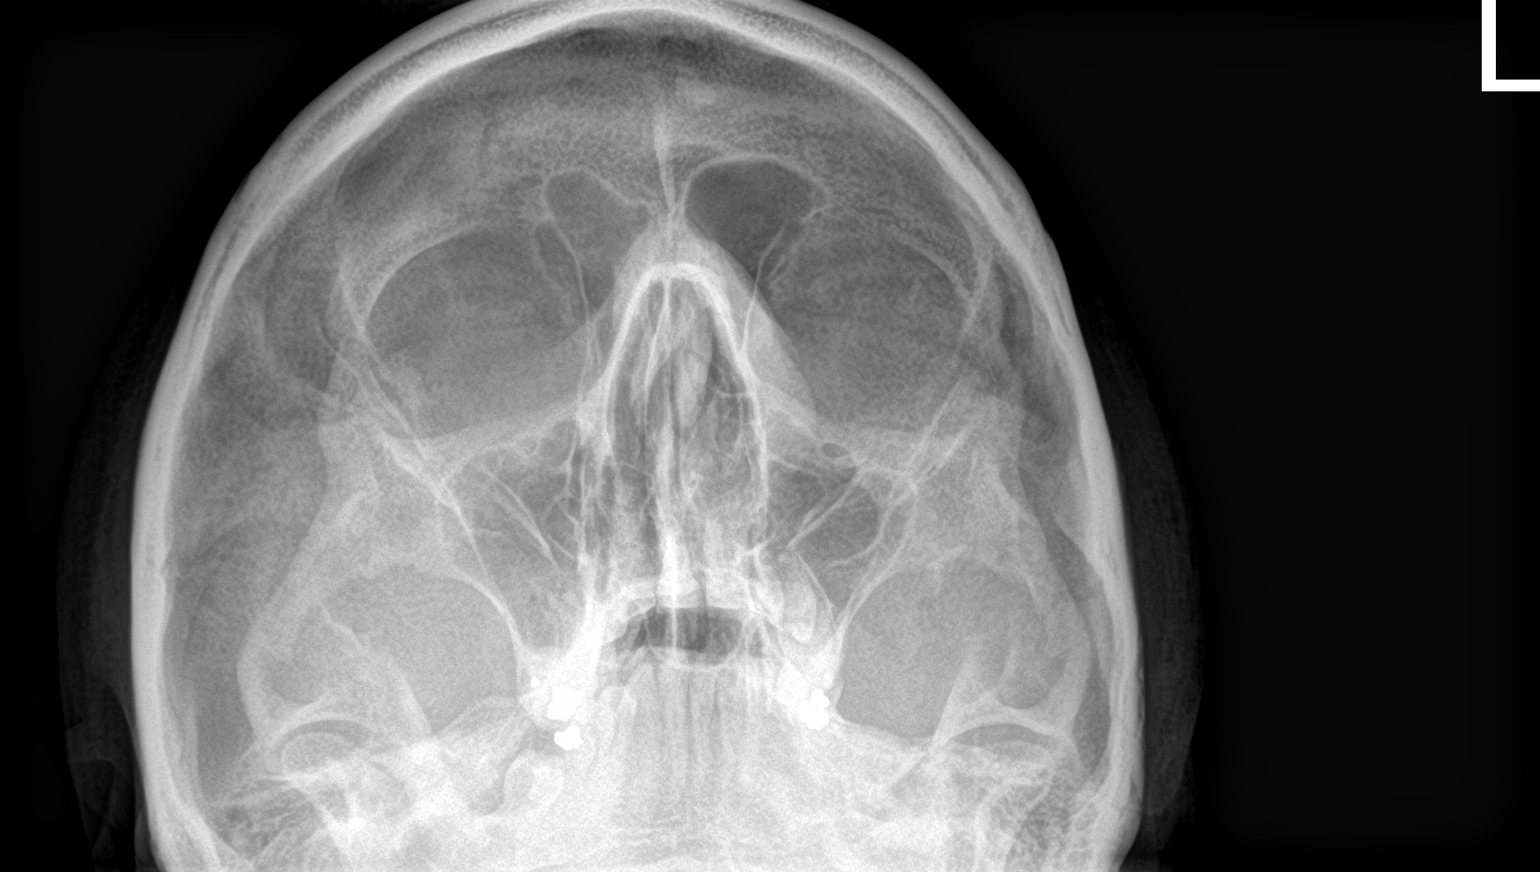

[orbits waters (2 of 2)]
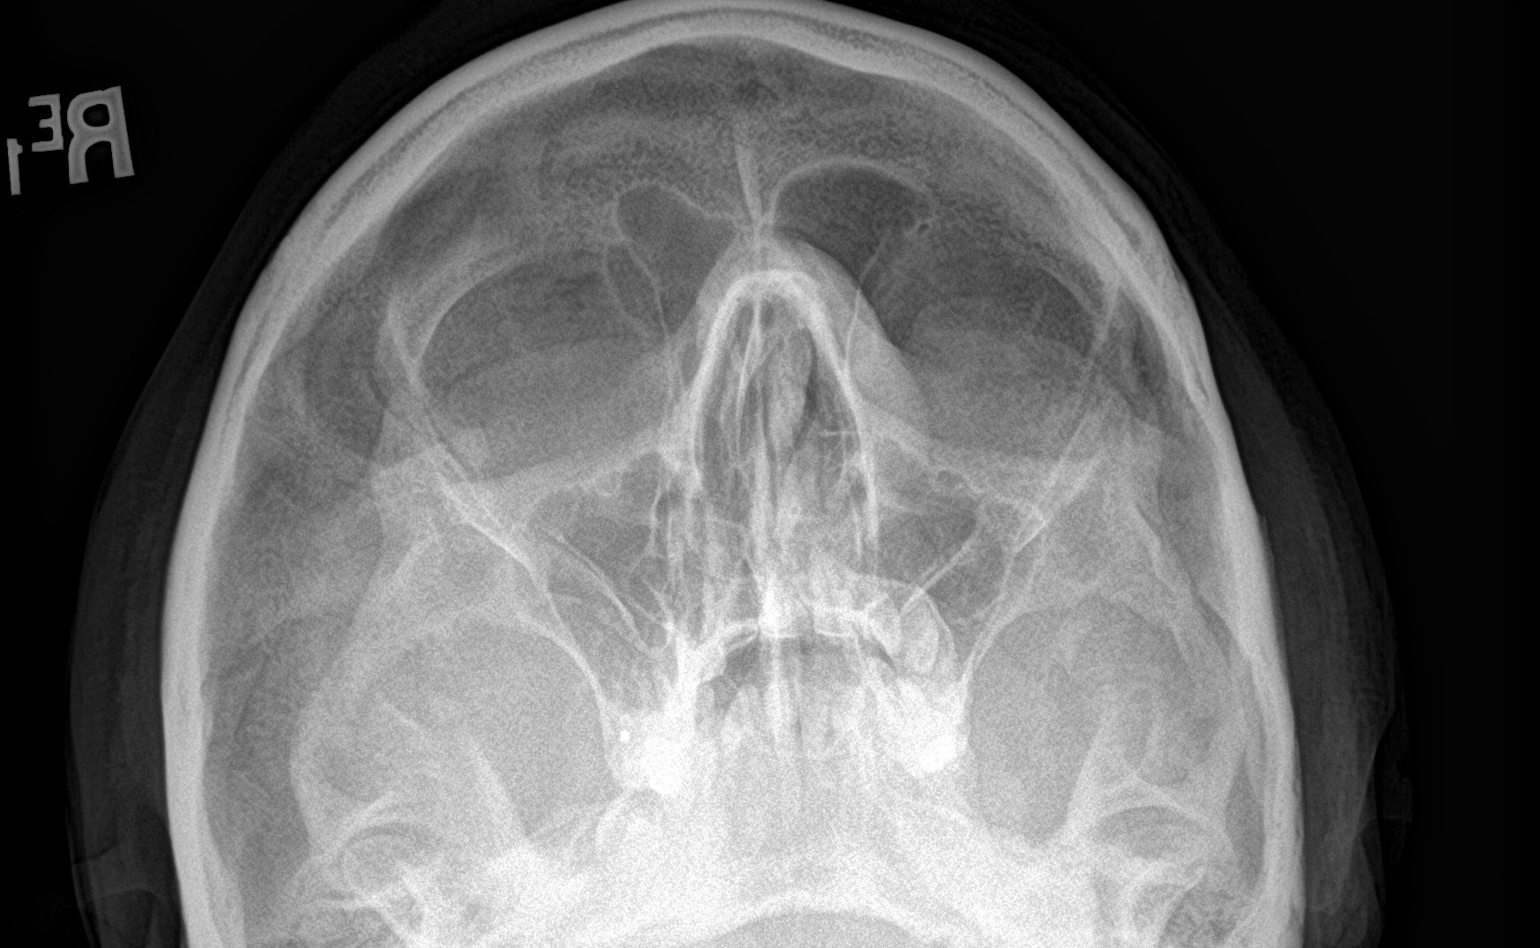

[2 of 2 positions shown; findings below may reference images not displayed]

FINDINGS: There is no evidence of metallic foreign body within the orbits. No
significant bone abnormality identified.
IMPRESSION: No evidence of metallic foreign body within the orbits.

## 2021-06-25 IMAGING — MR MR CERVICAL SPINE WO/W CM
19 of 25 series · 34 of 48 positions shown · non-contrast
Comparison: None

CLINICAL DATA: Right leg weakness. Spasticity. Bilateral hand
numbness. Hyperreflexia. Neck pain. Demyelinating disease; rule out
demyelinating disease.

EXAM:
MRI HEAD WITHOUT CONTRAST
MRI CERVICAL SPINE WITHOUT CONTRAST
TECHNIQUE: Multiplanar, multiecho pulse sequences of the brain and surrounding
structures, and cervical spine, to include the craniocervical
junction and cervicothoracic junction, were obtained without
intravenous contrast.

[Series 7: T1 · sagittal · 5.0mm · 0.75mm/px · 2 of 28 slices shown (1 of 4)]
[im 1/28]
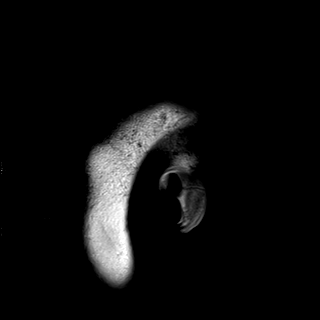
[im 28/28]
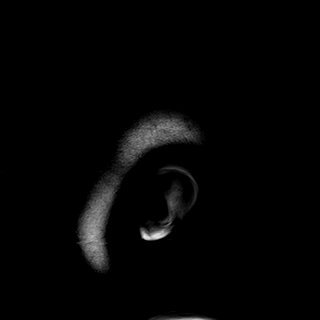

[Series 12: DWI · axial · 3.0mm · 1.36mm/px · z∈[-52,+100]mm · 4 of 108 slices shown (1 of 2)]
[im 1/108]
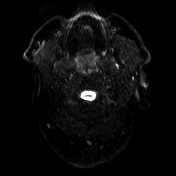
[im 36/108]
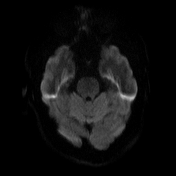
[im 72/108]
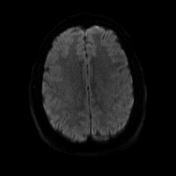
[im 108/108]
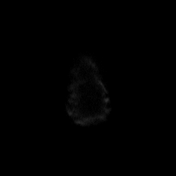

[Series 13: DWI · axial · 3.0mm · 1.36mm/px · z∈[-52,+100]mm · 2 of 54 slices shown (2 of 2)]
[im 1/54]
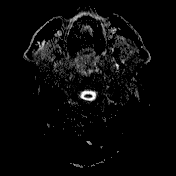
[im 54/54]
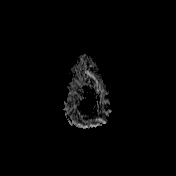

[Series 14: FLAIR · sagittal · 1.1mm · 0.50mm/px · 3 of 128 slices shown]
[im 1/128]
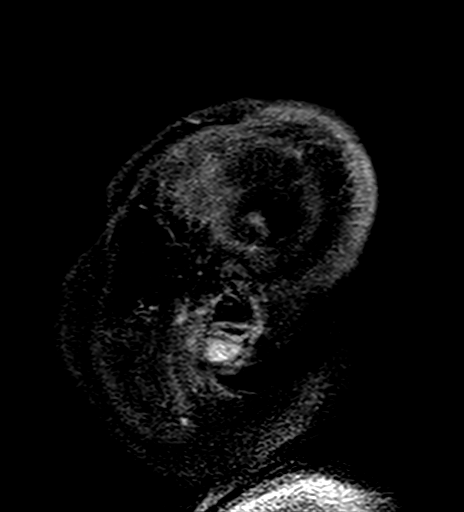
[im 32/128]
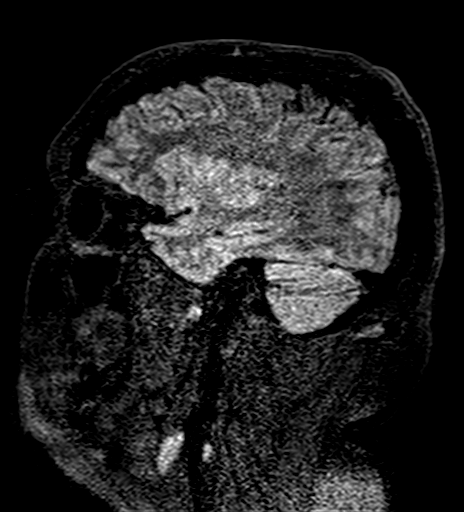
[im 64/128]
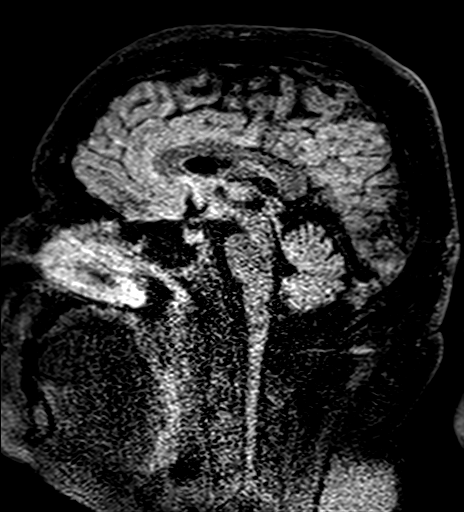

[Series 15: T2 · axial · 5.0mm · 0.62mm/px · 1 of 25 slices shown (1 of 3)]
[im 1/25]
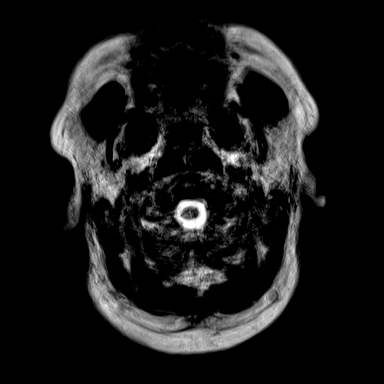

[Series 23: T1 · axial · 1.0mm · 0.94mm/px · z∈[-77,+79]mm · 7 of 160 slices shown (2 of 4)]
[im 1/160]
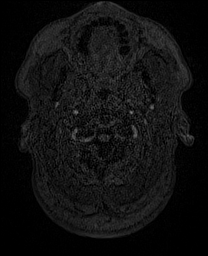
[im 27/160]
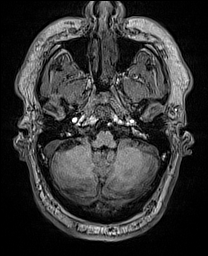
[im 54/160]
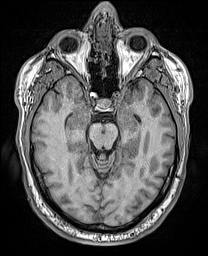
[im 80/160]
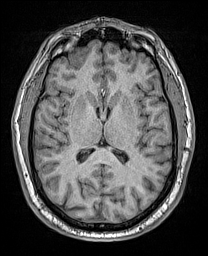
[im 107/160]
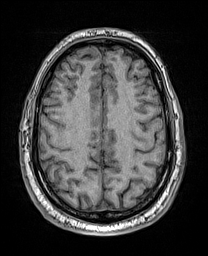
[im 133/160]
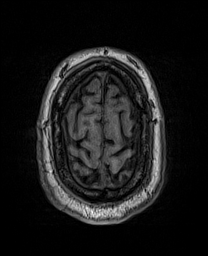
[im 160/160]
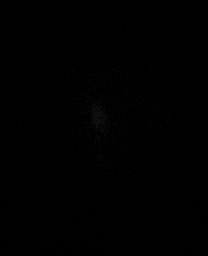

[Series 24: T2 · axial · 5.0mm · 0.62mm/px · 1 of 25 slices shown (2 of 3)]
[im 1/25]
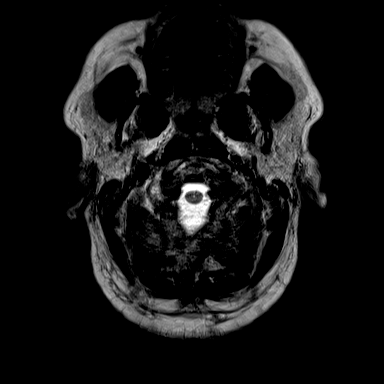

[Series 28: T2 post-contrast · coronal · 5.0mm · 0.57mm/px · 2 of 40 slices shown (1 of 2)]
[im 1/40]
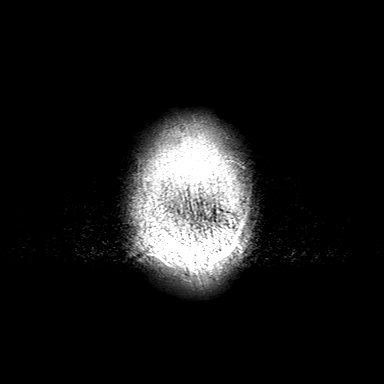
[im 40/40]
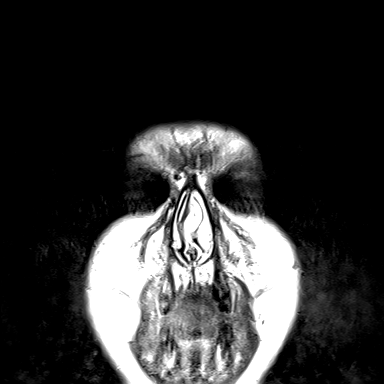

[Series 33: T1 · sagittal · 3.0mm · 0.69mm/px · 1 of 15 slices shown (3 of 4)]
[im 1/15]
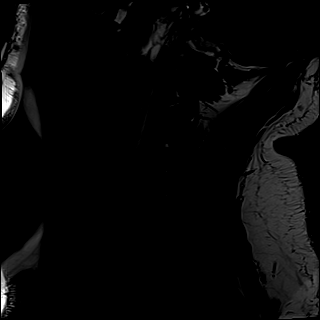

[Series 34: STIR · sagittal · 3.0mm · 0.86mm/px · 1 of 15 slices shown]
[im 1/15]
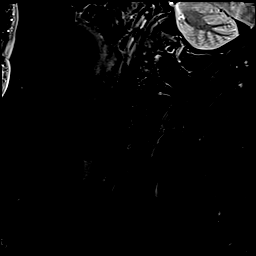

[Series 35: T2 · axial · 3.0mm · 0.70mm/px · 1 of 32 slices shown (3 of 3)]
[im 1/32]
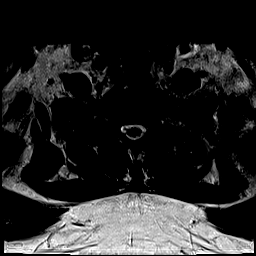

[Series 36: T1 · axial · 3.0mm · 0.35mm/px · 1 of 32 slices shown (4 of 4)]
[im 1/32]
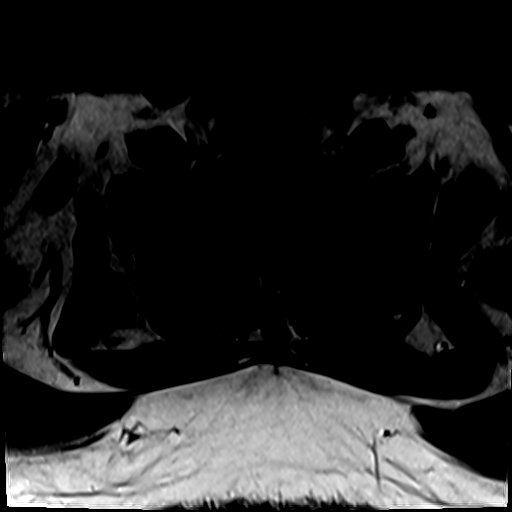

[Series 38: T2 post-contrast · sagittal · 3.0mm · 0.69mm/px · 1 of 15 slices shown (2 of 2)]
[im 1/15]
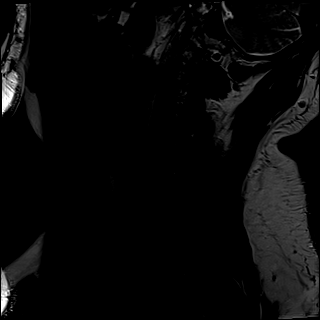

[Series 39: T1 fat-sat post-contrast · sagittal · 3.0mm · 0.69mm/px · 1 of 15 slices shown (1 of 2)]
[im 1/15]
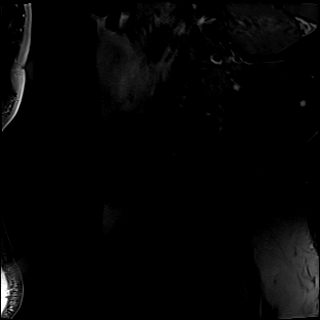

[Series 40: T1 post-contrast · axial · 3.0mm · 0.35mm/px · 1 of 32 slices shown (1 of 4)]
[im 1/32]
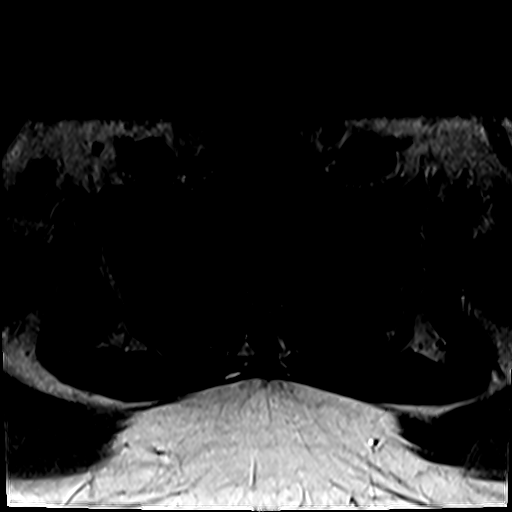

[Series 41: T1 fat-sat post-contrast · sagittal · 3.0mm · 0.69mm/px · 1 of 15 slices shown (2 of 2)]
[im 1/15]
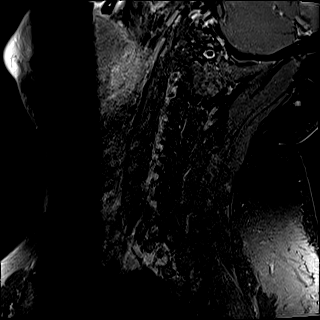

[Series 50: T1 post-contrast · coronal · 5.0mm · 0.43mm/px · 1 of 32 slices shown (2 of 4)]
[im 1/32]
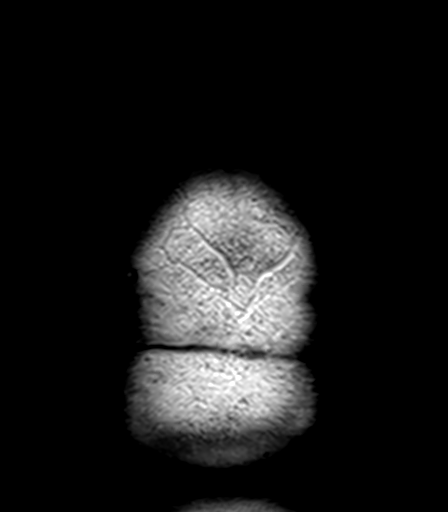

[Series 51: T1 post-contrast · sagittal · 5.0mm · 0.94mm/px · 1 of 26 slices shown (3 of 4)]
[im 1/26]
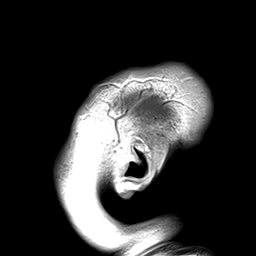

[Series 52: T1 post-contrast · axial · 3.0mm · 0.45mm/px · z∈[+3,+143]mm · 2 of 51 slices shown (4 of 4)]
[im 1/51]
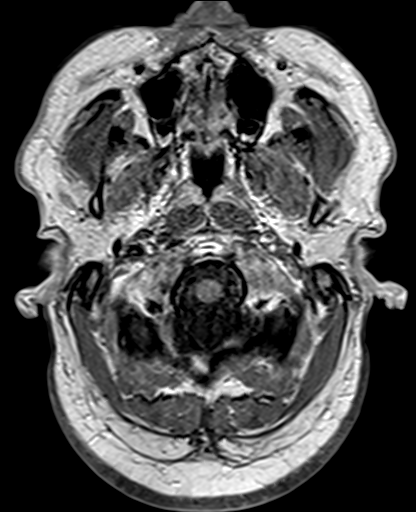
[im 51/51]
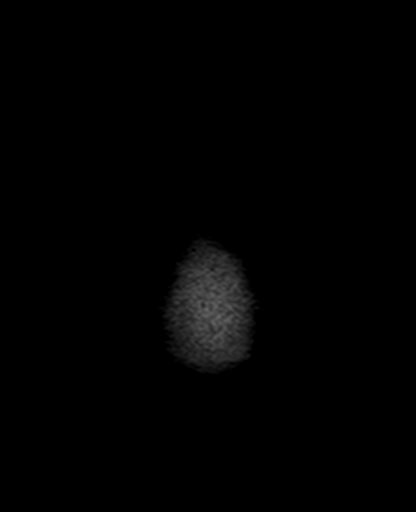

[34 of 48 positions shown; findings below may reference images not displayed]

FINDINGS: MRI HEAD FINDINGS

Brain:

The examination is intermittently motion degraded, limiting
evaluation. Most notably, there is moderate motion degradation of
the sagittal T2 FLAIR sequence, moderate/severe motion degradation
of the axial T2 TSE sequences, moderate motion degradation of the
axial SWI sequence, moderate motion degradation of the coronal T2
TSE sequence and mild-to-moderate motion degradation of the sagittal
T1 weighted postcontrast sequence.

Cerebral volume is normal. 3 mm pineal cyst. No cortical
encephalomalacia is identified. No significant cerebral white matter
disease. Within the limitations of motion degradation, there is no
evidence of acute infarct, intracranial mass, extra-axial fluid
collection or chronic intracranial blood products. No midline shift.
No pathologic intracranial enhancement is identified.

Vascular: Maintained flow voids within the proximal large arterial
vessels.

Skull and upper cervical spine: Focal suspicious marrow lesion.

Sinuses/Orbits: Trace mucosal thickening within the bilateral
ethmoid air cells.

MRI CERVICAL SPINE FINDINGS

Intermittently motion degraded examination, limiting evaluation.
Most notably, there is moderate motion degradation of the axial T2
TSE sequence, severe motion degradation of the axial T2 GRE
sequence, moderate motion degradation of the axial T1 weighted
precontrast sequence and severe motion degradation of the axial T1
weighted postcontrast sequence.

Alignment: No significant spondylolisthesis.

Vertebrae: Vertebral body height is maintained. No significant
marrow edema or focal suspicious osseous lesion.

Cord: Marked spinal cord flattening at C5-C6, as described below. T2
hyperintense signal abnormality within the spinal cord at this level
compatible with focal edema and/or myelomalacia (best appreciated on
series 34, image 7).

Posterior Fossa, vertebral arteries, paraspinal tissues: Posterior
fossa better assessed on same-day brain MRI. Flow voids preserved
within the imaged cervical vertebral arteries. Paraspinal soft
tissues unremarkable.

Disc levels:

No more than mild disc degeneration within the cervical spine.

Mild congenital narrowing of the cervical spinal canal.

C2-C3: No significant disc herniation or stenosis.

C3-C4: Disc bulge. Bilateral uncovertebral hypertrophy. Mild spinal
canal narrowing (without spinal cord mass effect). Bilateral neural
foraminal narrowing (mild right, moderate left).

C4-C5: Mild facet arthrosis. Mild bilateral uncovertebral
hypertrophy. No significant spinal canal stenosis. Mild bilateral
neural foraminal narrowing.

C5-C6: Disc bulge. Superimposed sizable central disc extrusion with
mild caudal migration. Bilateral uncovertebral hypertrophy. Facet
arthrosis. The disc extrusion contributes to severe spinal canal
stenosis with marked spinal cord flattening. T2 hyperintense signal
abnormality within the spinal cord at this level compatible with
focal edema and/or myelomalacia. Mild bilateral neural foraminal
narrowing.

C6-C7: Possible small central disc protrusion. Facet arthrosis
(greater on the left). Mild spinal canal narrowing (without spinal
cord mass effect). Mild relative left neural foraminal narrowing.

C7-T1: Facet arthrosis (greater on the left). No significant spinal
canal or foraminal stenosis. Mild relative left neural foraminal
narrowing.

MRI cervical spine impression #3 will be called to the ordering
clinician or representative by the Radiologist Assistant, and
communication documented in the PACS or [REDACTED].
IMPRESSION: MRI brain:

1. No evidence of acute intracranial abnormality.
2. 3 mm pineal cyst, likely incidental.
3. Within the limitations of a motion degraded exam, there is an
otherwise unremarkable MRI appearance of the brain.
4. Minimal bilateral ethmoid sinus mucosal thickening.

MRI cervical spine:

1. Significantly motion degraded examination, limiting evaluation.
2. Cervical spondylosis, as outlined and with findings most notably
as follows.
3. At C5-C6, a sizable caudally migrated central disc extrusion
contributes to multifactorial severe spinal canal stenosis with
marked spinal cord flattening. T2 hyperintense signal abnormality
within the spinal cord at this level compatible with focal edema
and/or myelomalacia. Mild bilateral neural foraminal narrowing.
4. No more than mild spinal canal stenosis at the remaining levels.
Additional sites of foraminal stenosis, as detailed and greatest on
the left at C3-C4 (moderate in severity at this site).

## 2021-06-25 IMAGING — MR MR HEAD WO/W CM
17 of 25 series · 32 of 48 positions shown · non-contrast
Comparison: None

CLINICAL DATA: Right leg weakness. Spasticity. Bilateral hand
numbness. Hyperreflexia. Neck pain. Demyelinating disease; rule out
demyelinating disease.

EXAM:
MRI HEAD WITHOUT CONTRAST
MRI CERVICAL SPINE WITHOUT CONTRAST
TECHNIQUE: Multiplanar, multiecho pulse sequences of the brain and surrounding
structures, and cervical spine, to include the craniocervical
junction and cervicothoracic junction, were obtained without
intravenous contrast.

[Series 12: DWI · axial · 3.0mm · 1.36mm/px · z∈[-52,+100]mm · 4 of 108 slices shown (1 of 4)]
[im 1/108]
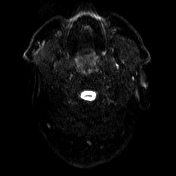
[im 36/108]
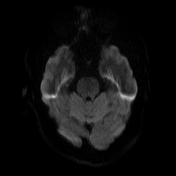
[im 72/108]
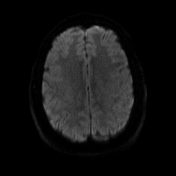
[im 108/108]
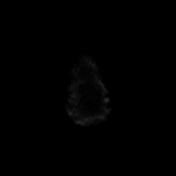

[Series 13: DWI · axial · 3.0mm · 1.36mm/px · z∈[-52,+100]mm · 2 of 54 slices shown (2 of 4)]
[im 1/54]
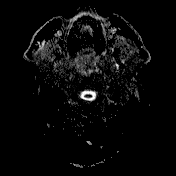
[im 54/54]
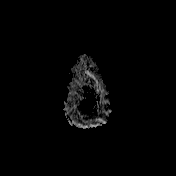

[Series 14: FLAIR · sagittal · 1.1mm · 0.50mm/px · 5 of 128 slices shown (1 of 2)]
[im 1/128]
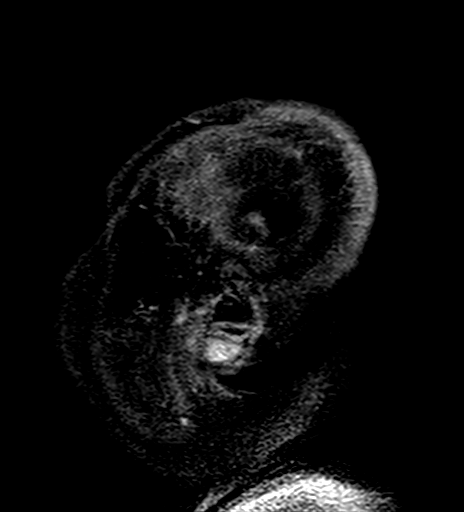
[im 32/128]
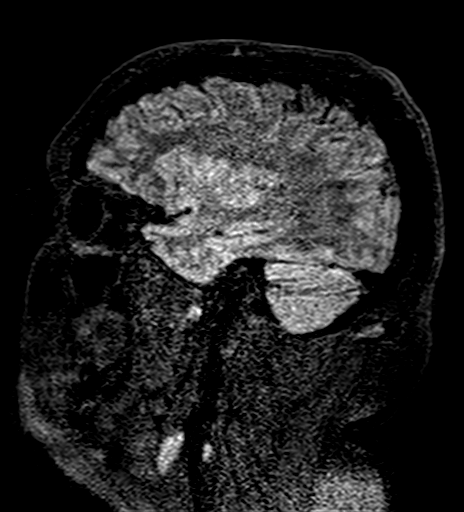
[im 64/128]
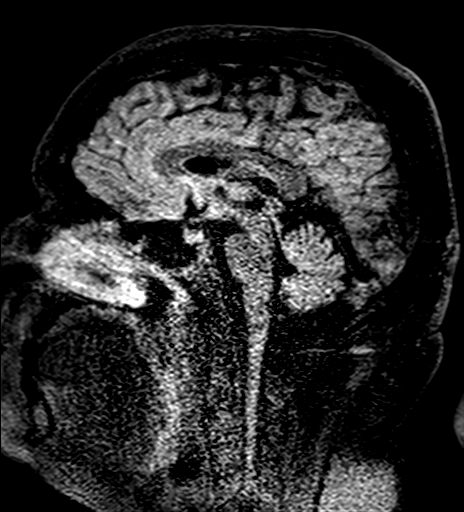
[im 96/128]
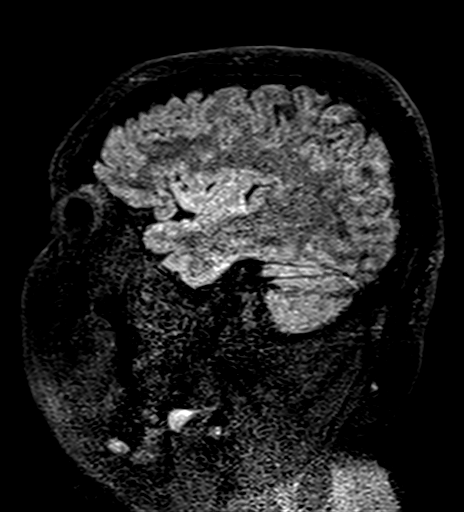
[im 128/128]
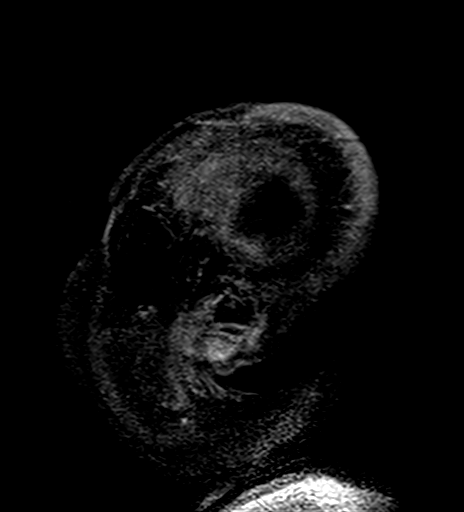

[Series 15: T2 · axial · 5.0mm · 0.62mm/px · 1 of 25 slices shown (1 of 3)]
[im 1/25]
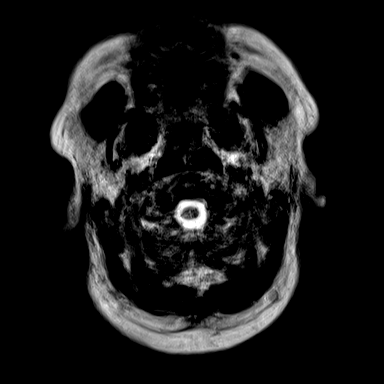

[Series 22: FLAIR · axial · 3.0mm · 0.75mm/px · z∈[-60,+91]mm · 2 of 54 slices shown (2 of 2)]
[im 1/54]
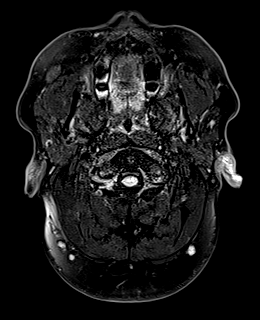
[im 54/54]
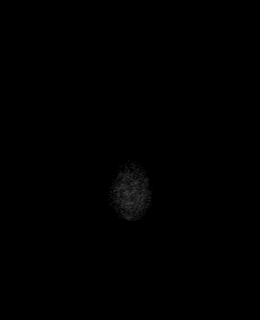

[Series 24: T2 · axial · 5.0mm · 0.62mm/px · 1 of 25 slices shown (2 of 3)]
[im 1/25]
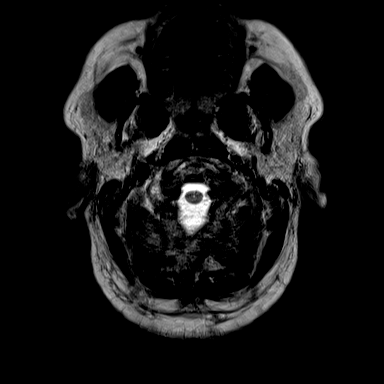

[Series 25: GRE · axial · 3.0mm · 0.45mm/px · z∈[-58,+85]mm · 2 of 51 slices shown (1 of 2)]
[im 1/51]
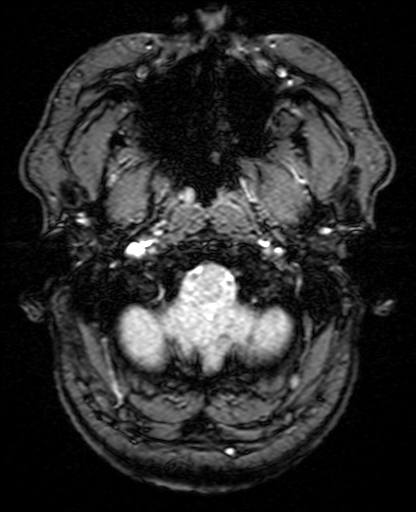
[im 51/51]
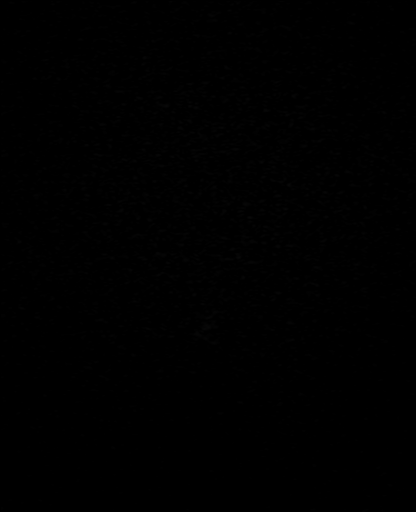

[Series 26: DWI · coronal · 5.0mm · 1.31mm/px · 3 of 80 slices shown (3 of 4)]
[im 1/80]
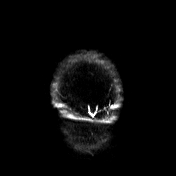
[im 40/80]
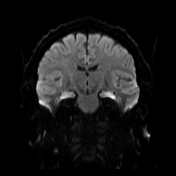
[im 80/80]
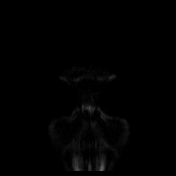

[Series 27: DWI · coronal · 5.0mm · 1.31mm/px · 2 of 40 slices shown (4 of 4)]
[im 1/40]
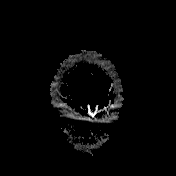
[im 40/40]
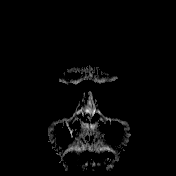

[Series 28: T2 post-contrast · coronal · 5.0mm · 0.57mm/px · 2 of 40 slices shown (1 of 2)]
[im 1/40]
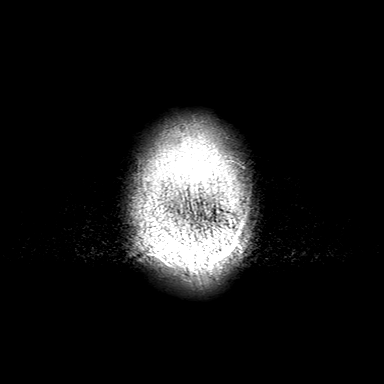
[im 40/40]
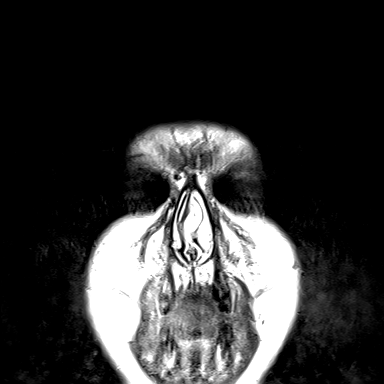

[Series 35: T2 · axial · 3.0mm · 0.70mm/px · 1 of 32 slices shown (3 of 3)]
[im 1/32]
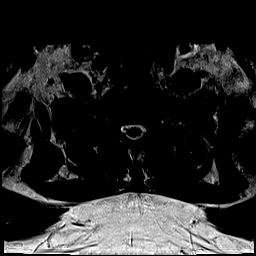

[Series 37: GRE · axial · 3.0mm · 0.35mm/px · 1 of 32 slices shown (2 of 2)]
[im 1/32]
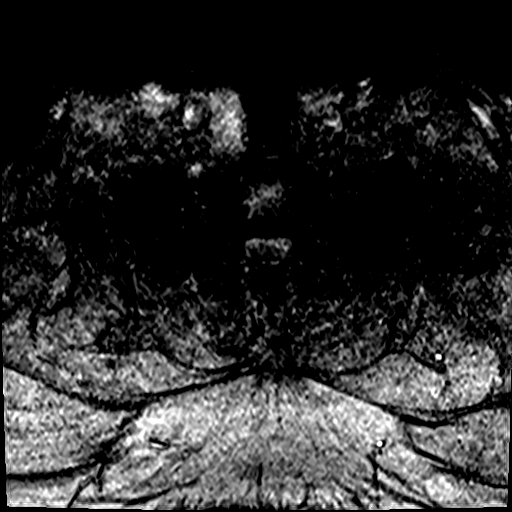

[Series 38: T2 post-contrast · sagittal · 3.0mm · 0.69mm/px · 1 of 15 slices shown (2 of 2)]
[im 1/15]
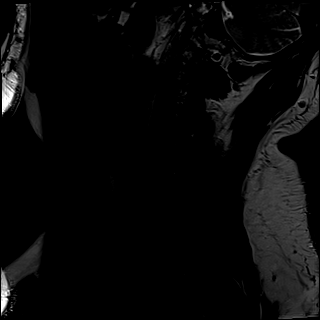

[Series 40: T1 post-contrast · axial · 3.0mm · 0.35mm/px · 1 of 32 slices shown (1 of 4)]
[im 1/32]
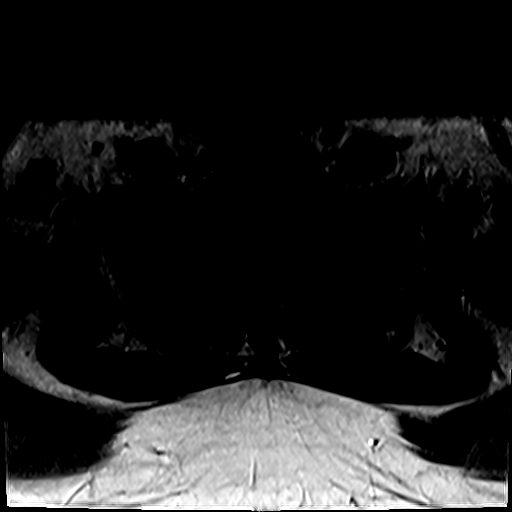

[Series 50: T1 post-contrast · coronal · 5.0mm · 0.43mm/px · 1 of 32 slices shown (2 of 4)]
[im 1/32]
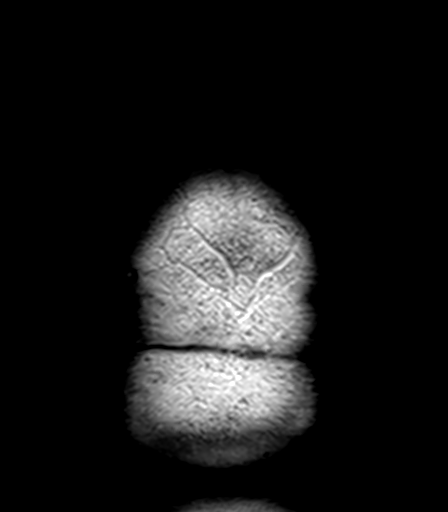

[Series 51: T1 post-contrast · sagittal · 5.0mm · 0.94mm/px · 1 of 26 slices shown (3 of 4)]
[im 1/26]
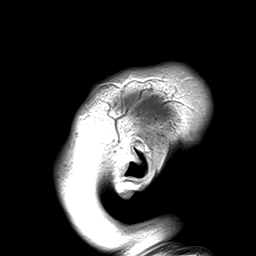

[Series 52: T1 post-contrast · axial · 3.0mm · 0.45mm/px · z∈[+3,+143]mm · 2 of 51 slices shown (4 of 4)]
[im 1/51]
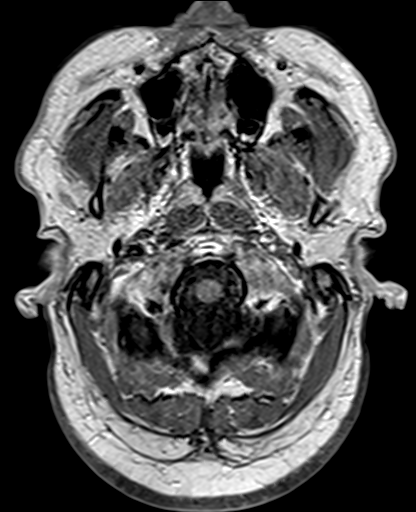
[im 51/51]
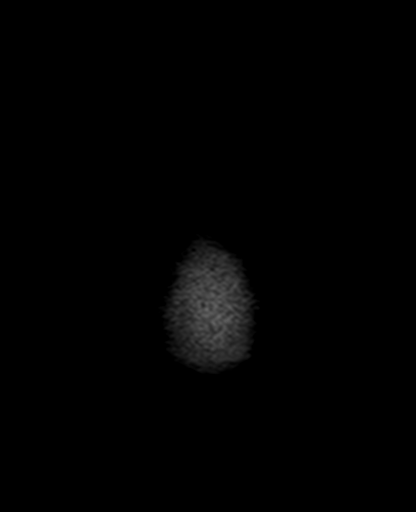

[32 of 48 positions shown; findings below may reference images not displayed]

FINDINGS: MRI HEAD FINDINGS

Brain:

The examination is intermittently motion degraded, limiting
evaluation. Most notably, there is moderate motion degradation of
the sagittal T2 FLAIR sequence, moderate/severe motion degradation
of the axial T2 TSE sequences, moderate motion degradation of the
axial SWI sequence, moderate motion degradation of the coronal T2
TSE sequence and mild-to-moderate motion degradation of the sagittal
T1 weighted postcontrast sequence.

Cerebral volume is normal. 3 mm pineal cyst. No cortical
encephalomalacia is identified. No significant cerebral white matter
disease. Within the limitations of motion degradation, there is no
evidence of acute infarct, intracranial mass, extra-axial fluid
collection or chronic intracranial blood products. No midline shift.
No pathologic intracranial enhancement is identified.

Vascular: Maintained flow voids within the proximal large arterial
vessels.

Skull and upper cervical spine: Focal suspicious marrow lesion.

Sinuses/Orbits: Trace mucosal thickening within the bilateral
ethmoid air cells.

MRI CERVICAL SPINE FINDINGS

Intermittently motion degraded examination, limiting evaluation.
Most notably, there is moderate motion degradation of the axial T2
TSE sequence, severe motion degradation of the axial T2 GRE
sequence, moderate motion degradation of the axial T1 weighted
precontrast sequence and severe motion degradation of the axial T1
weighted postcontrast sequence.

Alignment: No significant spondylolisthesis.

Vertebrae: Vertebral body height is maintained. No significant
marrow edema or focal suspicious osseous lesion.

Cord: Marked spinal cord flattening at C5-C6, as described below. T2
hyperintense signal abnormality within the spinal cord at this level
compatible with focal edema and/or myelomalacia (best appreciated on
series 34, image 7).

Posterior Fossa, vertebral arteries, paraspinal tissues: Posterior
fossa better assessed on same-day brain MRI. Flow voids preserved
within the imaged cervical vertebral arteries. Paraspinal soft
tissues unremarkable.

Disc levels:

No more than mild disc degeneration within the cervical spine.

Mild congenital narrowing of the cervical spinal canal.

C2-C3: No significant disc herniation or stenosis.

C3-C4: Disc bulge. Bilateral uncovertebral hypertrophy. Mild spinal
canal narrowing (without spinal cord mass effect). Bilateral neural
foraminal narrowing (mild right, moderate left).

C4-C5: Mild facet arthrosis. Mild bilateral uncovertebral
hypertrophy. No significant spinal canal stenosis. Mild bilateral
neural foraminal narrowing.

C5-C6: Disc bulge. Superimposed sizable central disc extrusion with
mild caudal migration. Bilateral uncovertebral hypertrophy. Facet
arthrosis. The disc extrusion contributes to severe spinal canal
stenosis with marked spinal cord flattening. T2 hyperintense signal
abnormality within the spinal cord at this level compatible with
focal edema and/or myelomalacia. Mild bilateral neural foraminal
narrowing.

C6-C7: Possible small central disc protrusion. Facet arthrosis
(greater on the left). Mild spinal canal narrowing (without spinal
cord mass effect). Mild relative left neural foraminal narrowing.

C7-T1: Facet arthrosis (greater on the left). No significant spinal
canal or foraminal stenosis. Mild relative left neural foraminal
narrowing.

MRI cervical spine impression #3 will be called to the ordering
clinician or representative by the Radiologist Assistant, and
communication documented in the PACS or [REDACTED].
IMPRESSION: MRI brain:

1. No evidence of acute intracranial abnormality.
2. 3 mm pineal cyst, likely incidental.
3. Within the limitations of a motion degraded exam, there is an
otherwise unremarkable MRI appearance of the brain.
4. Minimal bilateral ethmoid sinus mucosal thickening.

MRI cervical spine:

1. Significantly motion degraded examination, limiting evaluation.
2. Cervical spondylosis, as outlined and with findings most notably
as follows.
3. At C5-C6, a sizable caudally migrated central disc extrusion
contributes to multifactorial severe spinal canal stenosis with
marked spinal cord flattening. T2 hyperintense signal abnormality
within the spinal cord at this level compatible with focal edema
and/or myelomalacia. Mild bilateral neural foraminal narrowing.
4. No more than mild spinal canal stenosis at the remaining levels.
Additional sites of foraminal stenosis, as detailed and greatest on
the left at C3-C4 (moderate in severity at this site).

## 2021-06-25 MED ORDER — GADOBUTROL 1 MMOL/ML IV SOLN
10.0000 mL | Freq: Once | INTRAVENOUS | Status: AC | PRN
  Administered 2021-06-25: 10 mL via INTRAVENOUS

## 2021-06-25 NOTE — Telephone Encounter (Signed)
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    I called the patient , but could only reach VM with results.  I will ask Dr. Marjory Lies to arrange urgently for cervical neurosurgical intervention.  Melvyn Novas, MD

## 2021-06-27 ENCOUNTER — Other Ambulatory Visit: Payer: 59

## 2021-06-28 NOTE — Telephone Encounter (Signed)
Referral sent to [facility], requested urgent appointment. Phone: [phone].

## 2021-06-28 NOTE — Addendum Note (Signed)
Addended by: Joycelyn Schmid R on: 06/28/2021 08:49 AM   Modules accepted: Orders

## 2021-06-30 NOTE — Telephone Encounter (Signed)
Spoke with [facility], they are not in-network. Sending to [facility]. Phone: [phone].

## 2022-09-29 ENCOUNTER — Ambulatory Visit (INDEPENDENT_AMBULATORY_CARE_PROVIDER_SITE_OTHER): Payer: Medicaid Other | Admitting: Family Medicine

## 2022-09-29 ENCOUNTER — Encounter: Payer: Self-pay | Admitting: Family Medicine

## 2022-09-29 VITALS — BP 171/97 | HR 110 | Ht 72.0 in | Wt 317.0 lb

## 2022-09-29 DIAGNOSIS — R609 Edema, unspecified: Secondary | ICD-10-CM | POA: Diagnosis not present

## 2022-09-29 DIAGNOSIS — R350 Frequency of micturition: Secondary | ICD-10-CM

## 2022-09-29 DIAGNOSIS — R739 Hyperglycemia, unspecified: Secondary | ICD-10-CM

## 2022-09-29 DIAGNOSIS — I10 Essential (primary) hypertension: Secondary | ICD-10-CM

## 2022-09-29 LAB — URINALYSIS, COMPLETE
Bilirubin, UA: NEGATIVE
Glucose, UA: NEGATIVE
Ketones, UA: NEGATIVE
Leukocytes,UA: NEGATIVE
Nitrite, UA: NEGATIVE
Protein,UA: NEGATIVE
RBC, UA: NEGATIVE
Specific Gravity, UA: 1.025 (ref 1.005–1.030)
Urobilinogen, Ur: 1 mg/dL (ref 0.2–1.0)
pH, UA: 6 (ref 5.0–7.5)

## 2022-09-29 LAB — BAYER DCA HB A1C WAIVED: HB A1C (BAYER DCA - WAIVED): 6.5 % — ABNORMAL HIGH (ref 4.8–5.6)

## 2022-09-29 LAB — MICROSCOPIC EXAMINATION
Bacteria, UA: NONE SEEN
Epithelial Cells (non renal): NONE SEEN /hpf (ref 0–10)
RBC, Urine: NONE SEEN /hpf (ref 0–2)
Renal Epithel, UA: NONE SEEN /hpf
WBC, UA: NONE SEEN /hpf (ref 0–5)

## 2022-09-29 MED ORDER — LISINOPRIL 20 MG PO TABS: 20.0000 mg | ORAL_TABLET | Freq: Every day | ORAL | 3 refills | Status: DC

## 2022-09-29 NOTE — Progress Notes (Signed)
BP (!) 171/97   Pulse (!) 110   Ht 6' (1.829 m)   Wt (!) 317 lb (143.8 kg)   SpO2 97%   BMI 42.99 kg/m    Subjective:   Patient ID: Darrell Henry, male    DOB: August 23, 1985, 37 y.o.   MRN: [id number]  HPI: Darrell Henry is a 37 y.o. male presenting on 09/29/2022 for Edema (BLE) and Nocturia   HPI Patient is coming in to reestablish to be evaluated.  He says has been having problems with urinary frequency and bilateral lower extremity swelling and increased urination and fatigue and this is all been gradually increasing over the past few months.  He says he does have a family history of diabetes.  He also says that he did stop taking his blood pressure pill.  this is all been increasing to where his legs are so swollen that he has been having trouble walking as well.  He denies any shortness of breath or chest pain.  Relevant past medical, surgical, family and social history reviewed and updated as indicated. Interim medical history since our last visit reviewed. Allergies and medications reviewed and updated.  Review of Systems  Constitutional:  Negative for chills and fever.  Eyes:  Negative for visual disturbance.  Respiratory:  Negative for shortness of breath and wheezing.   Cardiovascular:  Positive for leg swelling. Negative for chest pain.  Gastrointestinal:  Negative for abdominal pain.  Genitourinary:  Positive for frequency and urgency. Negative for dysuria and hematuria.  Musculoskeletal:  Negative for arthralgias, back pain and gait problem.  Skin:  Negative for rash.  Neurological:  Negative for dizziness, weakness and light-headedness.  All other systems reviewed and are negative.   Per HPI unless specifically indicated above   Allergies as of 09/29/2022   No Known Allergies      Medication List        Accurate as of September 29, 2022 11:41 AM. If you have any questions, ask your nurse or doctor.          STOP taking these medications     lisinopril-hydrochlorothiazide 10-12.5 MG tablet Commonly known as: ZESTORETIC Stopped by: Fransisca Kaufmann Maydelin Deming, MD       TAKE these medications    lisinopril 20 MG tablet Commonly known as: ZESTRIL Take 1 tablet (20 mg total) by mouth daily. Started by: Worthy Rancher, MD   Suboxone 2-0.5 MG Film Generic drug: Buprenorphine HCl-Naloxone HCl Take 1 Film by mouth 2 (two) times a day.         Objective:   BP (!) 171/97   Pulse (!) 110   Ht 6' (1.829 m)   Wt (!) 317 lb (143.8 kg)   SpO2 97%   BMI 42.99 kg/m   Wt Readings from Last 3 Encounters:  09/29/22 (!) 317 lb (143.8 kg)  05/31/21 (!) 305 lb 9.6 oz (138.6 kg)  03/19/21 (!) 305 lb (138.3 kg)    Physical Exam Vitals and nursing note reviewed.  Constitutional:      General: He is not in acute distress.    Appearance: He is well-developed. He is not diaphoretic.  Eyes:     General: No scleral icterus.    Conjunctiva/sclera: Conjunctivae normal.  Neck:     Thyroid: No thyromegaly.  Cardiovascular:     Rate and Rhythm: Normal rate and regular rhythm.     Heart sounds: Normal heart sounds. No murmur heard. Pulmonary:     Effort: Pulmonary effort is  normal. No respiratory distress.     Breath sounds: Normal breath sounds. No wheezing or rales.  Chest:     Chest wall: No tenderness.  Musculoskeletal:        General: Swelling (3+ peripheral edema) present. Normal range of motion.     Cervical back: Neck supple.  Lymphadenopathy:     Cervical: No cervical adenopathy.  Skin:    General: Skin is warm and dry.     Findings: No rash.  Neurological:     Mental Status: He is alert and oriented to person, place, and time.     Coordination: Coordination normal.  Psychiatric:        Behavior: Behavior normal.       Assessment & Plan:   Problem List Items Addressed This Visit       Cardiovascular and Mediastinum   HTN (hypertension) - Primary   Relevant Medications   lisinopril (ZESTRIL) 20 MG  tablet   Other Relevant Orders   Bayer DCA Hb A1c Waived   CBC with Differential/Platelet   CMP14+EGFR   Lipid panel   TSH   Brain natriuretic peptide   Urinalysis, Complete   Other Visit Diagnoses     Peripheral edema       Relevant Orders   Bayer DCA Hb A1c Waived   CBC with Differential/Platelet   CMP14+EGFR   Lipid panel   TSH   Brain natriuretic peptide   Urinalysis, Complete   Frequency of urination       Relevant Orders   Bayer DCA Hb A1c Waived   CBC with Differential/Platelet   CMP14+EGFR   Lipid panel   TSH   Brain natriuretic peptide   Urinalysis, Complete   Elevated blood sugar       Relevant Orders   Bayer DCA Hb A1c Waived   CBC with Differential/Platelet   CMP14+EGFR   Lipid panel   TSH   Brain natriuretic peptide   Urinalysis, Complete     Patient will do extensive lab orders including A1c for diabetes and urinalysis and chemistry panel to check his kidneys and his liver.  Concerned that there is possible congestive heart failure so we will do BNP although most of symptoms for lower or urinary.  Follow up plan: Return if symptoms worsen or fail to improve, for 2 to 3-week follow-up.  Counseling provided for all of the vaccine components Orders Placed This Encounter  Procedures   Bayer DCA Hb A1c Waived   CBC with Differential/Platelet   CMP14+EGFR   Lipid panel   TSH   Brain natriuretic peptide   Urinalysis, Complete    Caryl Pina, MD [facility] 09/29/2022, 11:41 AM

## 2022-09-30 LAB — CMP14+EGFR
ALT: 21 IU/L (ref 0–44)
AST: 23 IU/L (ref 0–40)
Albumin/Globulin Ratio: 1.1 — ABNORMAL LOW (ref 1.2–2.2)
Albumin: 3.9 g/dL — ABNORMAL LOW (ref 4.1–5.1)
Alkaline Phosphatase: 58 IU/L (ref 44–121)
BUN/Creatinine Ratio: 22 — ABNORMAL HIGH (ref 9–20)
BUN: 16 mg/dL (ref 6–20)
Bilirubin Total: 0.2 mg/dL (ref 0.0–1.2)
CO2: 23 mmol/L (ref 20–29)
Calcium: 9.2 mg/dL (ref 8.7–10.2)
Chloride: 100 mmol/L (ref 96–106)
Creatinine, Ser: 0.72 mg/dL — ABNORMAL LOW (ref 0.76–1.27)
Globulin, Total: 3.5 g/dL (ref 1.5–4.5)
Glucose: 102 mg/dL — ABNORMAL HIGH (ref 70–99)
Potassium: 4.4 mmol/L (ref 3.5–5.2)
Sodium: 139 mmol/L (ref 134–144)
Total Protein: 7.4 g/dL (ref 6.0–8.5)
eGFR: 121 mL/min/{1.73_m2} (ref >59)

## 2022-09-30 LAB — CBC WITH DIFFERENTIAL/PLATELET
Basophils Absolute: 0 10*3/uL (ref 0.0–0.2)
Basos: 1 %
EOS (ABSOLUTE): 0.1 10*3/uL (ref 0.0–0.4)
Eos: 2 %
Hematocrit: 38.5 % (ref 37.5–51.0)
Hemoglobin: 12.9 g/dL — ABNORMAL LOW (ref 13.0–17.7)
Immature Grans (Abs): 0 10*3/uL (ref 0.0–0.1)
Immature Granulocytes: 0 %
Lymphocytes Absolute: 1.5 10*3/uL (ref 0.7–3.1)
Lymphs: 18 %
MCH: 27.7 pg (ref 26.6–33.0)
MCHC: 33.5 g/dL (ref 31.5–35.7)
MCV: 83 fL (ref 79–97)
Monocytes Absolute: 0.7 10*3/uL (ref 0.1–0.9)
Monocytes: 9 %
Neutrophils Absolute: 5.7 10*3/uL (ref 1.4–7.0)
Neutrophils: 70 %
Platelets: 424 10*3/uL (ref 150–450)
RBC: 4.66 x10E6/uL (ref 4.14–5.80)
RDW: 12.7 % (ref 11.6–15.4)
WBC: 8.1 10*3/uL (ref 3.4–10.8)

## 2022-09-30 LAB — LIPID PANEL
Chol/HDL Ratio: 4 ratio (ref 0.0–5.0)
Cholesterol, Total: 125 mg/dL (ref 100–199)
HDL: 31 mg/dL — ABNORMAL LOW (ref >39)
LDL Chol Calc (NIH): 72 mg/dL (ref 0–99)
Triglycerides: 120 mg/dL (ref 0–149)
VLDL Cholesterol Cal: 22 mg/dL (ref 5–40)

## 2022-09-30 LAB — TSH: TSH: 3.41 u[IU]/mL (ref 0.450–4.500)

## 2022-09-30 LAB — BRAIN NATRIURETIC PEPTIDE: BNP: 5.3 pg/mL (ref 0.0–100.0)

## 2022-10-12 ENCOUNTER — Telehealth: Payer: Self-pay | Admitting: Family Medicine

## 2022-10-12 NOTE — Telephone Encounter (Signed)
Please offer the cancellation list or have pt call for cancellations.

## 2022-10-13 ENCOUNTER — Ambulatory Visit: Payer: Medicaid Other | Admitting: Family Medicine

## 2022-10-13 NOTE — Telephone Encounter (Signed)
Pt made appt on march 7

## 2022-10-17 ENCOUNTER — Other Ambulatory Visit: Payer: Self-pay | Admitting: Neurology

## 2022-10-17 DIAGNOSIS — R292 Abnormal reflex: Secondary | ICD-10-CM

## 2022-10-17 DIAGNOSIS — M542 Cervicalgia: Secondary | ICD-10-CM

## 2022-10-17 DIAGNOSIS — R29898 Other symptoms and signs involving the musculoskeletal system: Secondary | ICD-10-CM

## 2022-10-17 DIAGNOSIS — R252 Cramp and spasm: Secondary | ICD-10-CM

## 2022-10-17 DIAGNOSIS — G959 Disease of spinal cord, unspecified: Secondary | ICD-10-CM

## 2022-10-17 NOTE — Progress Notes (Deleted)
[facility]  PATIENT: Darrell Henry DOB: 09-03-1985  REFERRING CLINICIAN: Dettinger, Fransisca Kaufmann, MD HISTORY FROM: patient  REASON FOR VISIT: ***   HISTORICAL  CHIEF COMPLAINT:  No chief complaint on file.   HISTORY OF PRESENT ILLNESS:   Update 10/18/2022 JM: Patient returns for follow-up request for ***.   Completed MRI brain 06/2021 which was unremarkable Completed MRI cervical which showed spinal cord flattening at C5-C6  Urgent referral to neurosurgery ***       Consult visit 05/31/2021 Dr. Leta Baptist: 37 year old male here for evaluation of right leg weakness.  For past 1 year patient is a gradual onset progressive right leg weakness, stiffness, spasms in the right foot and leg.  Also has some numbness in bilateral hands digits 1 5.  Also has some neck pain, low back pain, speech is stuttering difficulties, ataxic and drunk feeling gait, and abnormal sensation in his right thoracic region.  Symptoms fluctuate over time.  Sometimes he has flareup of symptoms lasting 2 to 3 days at a time.  No specific prodromal accidents injuries or traumas.  He does do [employer] as a hobby and has had some chronic pain issues related to this.  Patient is planning to move to [place] within the next few weeks.  He would like to get work-up done before moving there.   REVIEW OF SYSTEMS: Full 14 system review of systems performed and negative with exception of: as per HPI.  ALLERGIES: No Known Allergies  HOME MEDICATIONS: Outpatient Medications Prior to Visit  Medication Sig Dispense Refill   lisinopril (ZESTRIL) 20 MG tablet Take 1 tablet (20 mg total) by mouth daily. 90 tablet 3   SUBOXONE 2-0.5 MG FILM Take 1 Film by mouth 2 (two) times a day.   0   No facility-administered medications prior to visit.    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    PAST SURGICAL HISTORY: No past surgical history on file.  FAMILY HISTORY: Family History  Problem Relation Age of Onset   Diabetes Mother    Hypertension Mother    Diabetes Father    Hypertension Father     SOCIAL HISTORY: Social History   Socioeconomic History   Marital status: Single    Spouse name: Not on file   Number of children: 0   Years of education: Not on file   Highest education level: Associate degree: occupational, Hotel manager, or vocational program  Occupational History    Comment: employed  Tobacco Use   Smoking status: Former    Years: 15.00    Types: Cigarettes, E-cigarettes    Quit date: 05/03/2012    Years since quitting: 10.4   Smokeless tobacco: Never   Tobacco comments:    05/31/21 E CIG vape   Vaping Use   Vaping Use: Every day  Substance and Sexual Activity   Alcohol use: No   Drug use: No    Types: Heroin    Comment: previous heroine addiction and narcotic addiction; Suboxone since 2013.   Sexual activity: Not on file  Other Topics Concern   Not on file  Social History Narrative   Drugs:  Hx of Heroine abuse; suboxone since 2013.  Exercise: none   Social Determinants of Health   Financial Resource Strain: Not on file  Food  Insecurity: Not on file  Transportation Needs: Not on file  Physical Activity: Not on file  Stress: Not on file  Social Connections: Not on file  Intimate Partner Violence: Not on file     PHYSICAL EXAM  GENERAL EXAM/CONSTITUTIONAL: Vitals:  There were no vitals filed for this visit.  There is no height or weight on file to calculate BMI. Wt Readings from Last 3 Encounters:  09/29/22 (!) 317 lb (143.8 kg)  05/31/21 (!) 305 lb 9.6 oz (138.6 kg)  03/19/21 (!) 305 lb (138.3 kg)   Patient is in no distress; well developed, nourished and groomed; neck is supple  CARDIOVASCULAR: Examination of carotid arteries is normal; no  carotid bruits Regular rate and rhythm, no murmurs Examination of peripheral vascular system by observation and palpation is normal  EYES: Ophthalmoscopic exam of optic discs and posterior segments is normal; no papilledema or hemorrhages No results found.  MUSCULOSKELETAL: Gait, strength, tone, movements noted in Neurologic exam below  NEUROLOGIC: MENTAL STATUS:      No data to display         awake, alert, oriented to person, place and time recent and remote memory intact normal attention and concentration language fluent, comprehension intact, naming intact fund of knowledge appropriate  CRANIAL NERVE:  2nd - no papilledema on fundoscopic exam 2nd, 3rd, 4th, 6th - pupils equal and reactive to light, visual fields full to confrontation, extraocular muscles intact, no nystagmus 5th - facial sensation symmetric 7th - facial strength symmetric 8th - hearing intact 9th - palate elevates symmetrically, uvula midline 11th - shoulder shrug symmetric 12th - tongue protrusion midline  MOTOR:  normal bulk and tone, full strength in the BUE, BLE; EXCEPT INCREASE TONE IN RIGHT LOWER EXT AT KNEE; SUSTAINED CLONUS IN RIGHT ANKLES; 5-6 BEATS CLONUS IN LEFT ANKLES  SENSORY:  normal and symmetric to light touch, pinprick, temperature, vibration; EXCEPT SLIGHTLY REDUCES IN BILATERAL FEET  COORDINATION:  finger-nose-finger, fine finger movements --> SLIGHT DYSMETRIA IN BUE  REFLEXES:  deep tendon reflexes -> BUE 2+; KNEES 3; ANKLES 2+ CLONUS IN RIGHT > LET ANKLES POSITIVE HOFFMANS IN BUE MUTE TOES ON PLANTAR STIM POSITIVE SUPRAPATELLAR REFLEXES NEG CROSS ADDUCTORS  GAIT/STATION:  SPASTIC GAIT (RIGHT LEG WORSE THAN LEFT)     DIAGNOSTIC DATA (LABS, IMAGING, TESTING) - I reviewed patient records, labs, notes, testing and imaging myself where available.  Lab Results  Component Value Date   WBC 8.1 09/29/2022   HGB 12.9 (L) 09/29/2022   HCT 38.5 09/29/2022   MCV 83  09/29/2022   PLT 424 09/29/2022      Component Value Date/Time   NA 139 09/29/2022 1147   K 4.4 09/29/2022 1147   CL 100 09/29/2022 1147   CO2 23 09/29/2022 1147   GLUCOSE 102 (H) 09/29/2022 1147   GLUCOSE 149 (H) 03/02/2019 1848   BUN 16 09/29/2022 1147   CREATININE 0.72 (L) 09/29/2022 1147   CALCIUM 9.2 09/29/2022 1147   PROT 7.4 09/29/2022 1147   ALBUMIN 3.9 (L) 09/29/2022 1147   AST 23 09/29/2022 1147   ALT 21 09/29/2022 1147   ALKPHOS 58 09/29/2022 1147   BILITOT 0.2 09/29/2022 1147   GFRNONAA 106 04/24/2020 1446   GFRAA 123 04/24/2020 1446   Lab Results  Component Value Date   CHOL 125 09/29/2022   HDL 31 (L) 09/29/2022   LDLCALC 72 09/29/2022   LDLDIRECT 91 04/04/2017   TRIG 120 09/29/2022   CHOLHDL 4.0 09/29/2022   Lab Results  Component Value Date   HGBA1C 6.5 (H) 09/29/2022   Lab Results  Component Value Date   [id number] 02/21/2020   Lab Results  Component Value Date   TSH 3.410 09/29/2022    03/19/21 Xray lumbar - No acute osseous abnormality. - Minimal dextro thoracic levocurvature, incompletely assessed. - Discogenic and facet degenerative changes, maximal at L2-L3 and L5-S1 though overall similar to comparison prior.    ASSESSMENT AND PLAN  37 y.o. year old male here with 1 year of progressive right leg weakness, bilateral hand numbness, spastic gait, hyperreflexia, stuttering speech, concerning for central nervous system autoimmune or inflammatory process.  We will proceed with further work-up.   Dx:  No diagnosis found.     PLAN:  - check MRI brain and cervical spine (rule out multiple sclerosis)  No orders of the defined types were placed in this encounter.  No follow-ups on file.    I spent *** minutes of face-to-face and non-face-to-face time with patient.  This included previsit chart review, lab review, study review, order entry, electronic health record documentation, patient education  Frann Rider,  [facility]  [facility] [address] [place], [place] [address]  Phone [phone] Fax [phone] Note: This document was prepared with digital dictation and possible smart phrase technology. Any transcriptional errors that result from this process are unintentional.

## 2022-10-18 ENCOUNTER — Ambulatory Visit: Payer: Medicaid Other | Admitting: Adult Health

## 2022-10-18 ENCOUNTER — Telehealth: Payer: Self-pay | Admitting: Diagnostic Neuroimaging

## 2022-10-18 NOTE — Telephone Encounter (Signed)
Urgent referral sent to [facility], phone # [phone].

## 2022-10-18 NOTE — Telephone Encounter (Signed)
Contacted [facility] for notes, pt was never seen. New referral was placed for pt to go see [facility].

## 2022-10-18 NOTE — Telephone Encounter (Signed)
He is scheduled for 3/6 at 11am with Dr. Kathyrn Sheriff.

## 2022-10-19 ENCOUNTER — Other Ambulatory Visit (HOSPITAL_COMMUNITY): Payer: Self-pay | Admitting: Neurosurgery

## 2022-10-19 DIAGNOSIS — M4802 Spinal stenosis, cervical region: Secondary | ICD-10-CM

## 2022-10-20 ENCOUNTER — Ambulatory Visit: Payer: Medicaid Other | Admitting: Family Medicine

## 2022-10-26 ENCOUNTER — Ambulatory Visit (HOSPITAL_COMMUNITY)
Admission: RE | Admit: 2022-10-26 | Discharge: 2022-10-26 | Disposition: A | Discharge status: Home / Self Care | Payer: Medicaid Other | Source: Ambulatory Visit | Attending: Neurosurgery | Admitting: Neurosurgery

## 2022-10-26 ENCOUNTER — Ambulatory Visit: Payer: Medicaid Other | Admitting: Family Medicine

## 2022-10-26 DIAGNOSIS — M4802 Spinal stenosis, cervical region: Secondary | ICD-10-CM | POA: Insufficient documentation

## 2022-10-28 ENCOUNTER — Other Ambulatory Visit: Payer: Self-pay | Admitting: Neurosurgery

## 2022-11-01 ENCOUNTER — Other Ambulatory Visit: Payer: Self-pay | Admitting: Neurosurgery

## 2022-11-07 NOTE — Pre-Procedure Instructions (Signed)
Surgical Instructions    Your procedure is scheduled on November 17, 2022.  Report to [facility] Main Entrance "A" at 5:30 A.M., then check in with the Admitting office.  Call this number if you have problems the morning of surgery:  [phone]  If you have any questions prior to your surgery date call [phone]: Open Monday-Friday 8am-4pm If you experience any cold or flu symptoms such as cough, fever, chills, shortness of breath, etc. between now and your scheduled surgery, please notify us at the above number.     Remember:  Do not eat or drink after midnight the night before your surgery      Take these medicines the morning of surgery with A SIP OF WATER:  NONE   Please consult with your prescribing physician if/when you should stop taking Buprenorphine HCl-Naloxone.   As of today, STOP taking any Aspirin (unless otherwise instructed by your surgeon) Aleve, Naproxen, Ibuprofen, Motrin, Advil, Goody's, BC's, all herbal medications, fish oil, and all vitamins.                     Do NOT Smoke (Tobacco/Vaping) for 24 hours prior to your procedure.  If you use a CPAP at night, you may bring your mask/headgear for your overnight stay.   Contacts, glasses, piercing's, hearing aid's, dentures or partials may not be worn into surgery, please bring cases for these belongings.    For patients admitted to the hospital, discharge time will be determined by your treatment team.   Patients discharged the day of surgery will not be allowed to drive home, and someone needs to stay with them for 24 hours.  SURGICAL WAITING ROOM VISITATION Patients having surgery or a procedure may have no more than 2 support people in the waiting area - these visitors may rotate.   Children under the age of 71 must have an adult with them who is not the patient. If the patient needs to stay at the hospital during part of their recovery, the visitor guidelines for inpatient rooms apply. Pre-op nurse will  coordinate an appropriate time for 1 support person to accompany patient in pre-op.  This support person may not rotate.   Please refer to the [facility] website for the visitor guidelines for Inpatients (after your surgery is over and you are in a regular room).    Special instructions:   [place]- Preparing For Surgery  Before surgery, you can play an important role. Because skin is not sterile, your skin needs to be as free of germs as possible. You can reduce the number of germs on your skin by washing with CHG (chlorahexidine gluconate) Soap before surgery.  CHG is an antiseptic cleaner which kills germs and bonds with the skin to continue killing germs even after washing.    Oral Hygiene is also important to reduce your risk of infection.  Remember - BRUSH YOUR TEETH THE MORNING OF SURGERY WITH YOUR REGULAR TOOTHPASTE  Please do not use if you have an allergy to CHG or antibacterial soaps. If your skin becomes reddened/irritated stop using the CHG.  Do not shave (including legs and underarms) for at least 48 hours prior to first CHG shower. It is OK to shave your face.  Please follow these instructions carefully.   Shower the NIGHT BEFORE SURGERY and the MORNING OF SURGERY  If you chose to wash your hair, wash your hair first as usual with your normal shampoo.  After you shampoo, rinse your  hair and body thoroughly to remove the shampoo.  Use CHG Soap as you would any other liquid soap. You can apply CHG directly to the skin and wash gently with a scrungie or a clean washcloth.   Apply the CHG Soap to your body ONLY FROM THE NECK DOWN.  Do not use on open wounds or open sores. Avoid contact with your eyes, ears, mouth and genitals (private parts). Wash Face and genitals (private parts)  with your normal soap.   Wash thoroughly, paying special attention to the area where your surgery will be performed.  Thoroughly rinse your body with warm water from the neck down.  DO NOT  shower/wash with your normal soap after using and rinsing off the CHG Soap.  Pat yourself dry with a CLEAN TOWEL.  Wear CLEAN PAJAMAS to bed the night before surgery  Place CLEAN SHEETS on your bed the night before your surgery  DO NOT SLEEP WITH PETS.   Day of Surgery: Take a shower with CHG soap. Do not wear jewelry or makeup Do not wear lotions, powders, perfumes/colognes, or deodorant. Do not shave 48 hours prior to surgery.  Men may shave face and neck. Do not bring valuables to the hospital.  [facility] is not responsible for any belongings or valuables. Do not wear nail polish, gel polish, artificial nails, or any other type of covering on natural nails (fingers and toes) If you have artificial nails or gel coating that need to be removed by a nail salon, please have this removed prior to surgery. Artificial nails or gel coating may interfere with anesthesia's ability to adequately monitor your vital signs.  Wear Clean/Comfortable clothing the morning of surgery Remember to brush your teeth WITH YOUR REGULAR TOOTHPASTE.   Please read over the following fact sheets that you were given.    If you received a COVID test during your pre-op visit  it is requested that you wear a mask when out in public, stay away from anyone that may not be feeling well and notify your surgeon if you develop symptoms. If you have been in contact with anyone that has tested positive in the last 10 days please notify you surgeon.

## 2022-11-08 ENCOUNTER — Inpatient Hospital Stay (HOSPITAL_COMMUNITY)
Admission: RE | Admit: 2022-11-08 | Discharge: 2022-11-08 | Disposition: A | Discharge status: Home / Self Care | Payer: Medicaid Other | Source: Ambulatory Visit

## 2022-11-29 NOTE — Pre-Procedure Instructions (Signed)
Surgical Instructions    Your procedure is scheduled on Friday, April 29th.  Report to [facility] Main Entrance "A" at 5:30 A.M., then check in with the Admitting office.  Call this number if you have problems the morning of surgery:  [phone]  If you have any questions prior to your surgery date call [phone]: Open Monday-Friday 8am-4pm If you experience any cold or flu symptoms such as cough, fever, chills, shortness of breath, etc. between now and your scheduled surgery, please notify us at the above number.     Remember:  Do not eat or drink after midnight the night before your surgery   Take these medicines the morning of surgery with A SIP OF WATER  NONE  Consult with your primary care physician regarding stopping Buprenorphine HCl-Naloxone HCl 8-2 MG FILM preoperatively.   As of today, STOP taking any Aspirin (unless otherwise instructed by your surgeon) Aleve, Naproxen, Ibuprofen, Motrin, Advil, Goody's, BC's, all herbal medications, fish oil, and all vitamins.                     Do NOT Smoke (Tobacco/Vaping) for 24 hours prior to your procedure.  If you use a CPAP at night, you may bring your mask/headgear for your overnight stay.   Contacts, glasses, piercing's, hearing aid's, dentures or partials may not be worn into surgery, please bring cases for these belongings.    For patients admitted to the hospital, discharge time will be determined by your treatment team.   Patients discharged the day of surgery will not be allowed to drive home, and someone needs to stay with them for 24 hours.  SURGICAL WAITING ROOM VISITATION Patients having surgery or a procedure may have no more than 2 support people in the waiting area - these visitors may rotate.   Children under the age of 66 must have an adult with them who is not the patient. If the patient needs to stay at the hospital during part of their recovery, the visitor guidelines for inpatient rooms apply. Pre-op  nurse will coordinate an appropriate time for 1 support person to accompany patient in pre-op.  This support person may not rotate.   Please refer to the [facility] website for the visitor guidelines for Inpatients (after your surgery is over and you are in a regular room).    Special instructions:   [place]- Preparing For Surgery  Before surgery, you can play an important role. Because skin is not sterile, your skin needs to be as free of germs as possible. You can reduce the number of germs on your skin by washing with CHG (chlorahexidine gluconate) Soap before surgery.  CHG is an antiseptic cleaner which kills germs and bonds with the skin to continue killing germs even after washing.    Oral Hygiene is also important to reduce your risk of infection.  Remember - BRUSH YOUR TEETH THE MORNING OF SURGERY WITH YOUR REGULAR TOOTHPASTE   Day of Surgery: Take a shower with CHG soap. Do not wear jewelry or makeup Do not wear lotions, powders, perfumes/colognes, or deodorant. Do not shave 48 hours prior to surgery.  Men may shave face and neck. Do not bring valuables to the hospital.  [facility] is not responsible for any belongings or valuables. Do not wear nail polish, gel polish, artificial nails, or any other type of covering on natural nails (fingers and toes) If you have artificial nails or gel coating that need to be removed by  a nail salon, please have this removed prior to surgery. Artificial nails or gel coating may interfere with anesthesia's ability to adequately monitor your vital signs. Wear Clean/Comfortable clothing the morning of surgery Remember to brush your teeth WITH YOUR REGULAR TOOTHPASTE.   Please read over the following fact sheets that you were given.    If you received a COVID test during your pre-op visit  it is requested that you wear a mask when out in public, stay away from anyone that may not be feeling well and notify your surgeon if you develop symptoms.  If you have been in contact with anyone that has tested positive in the last 10 days please notify you surgeon.

## 2022-11-30 ENCOUNTER — Other Ambulatory Visit: Payer: Self-pay

## 2022-11-30 ENCOUNTER — Encounter (HOSPITAL_COMMUNITY)
Admission: RE | Admit: 2022-11-30 | Discharge: 2022-11-30 | Disposition: A | Discharge status: Home / Self Care | Payer: Medicaid Other | Source: Ambulatory Visit | Attending: Neurosurgery | Admitting: Neurosurgery

## 2022-11-30 ENCOUNTER — Encounter (HOSPITAL_COMMUNITY): Payer: Self-pay

## 2022-11-30 VITALS — BP 143/76 | HR 103 | Temp 97.5°F | Resp 19 | Ht 72.05 in | Wt 309.8 lb

## 2022-11-30 DIAGNOSIS — I10 Essential (primary) hypertension: Secondary | ICD-10-CM

## 2022-11-30 DIAGNOSIS — Z01818 Encounter for other preprocedural examination: Secondary | ICD-10-CM | POA: Insufficient documentation

## 2022-11-30 LAB — BASIC METABOLIC PANEL
Anion gap: 9 (ref 5–15)
BUN: 15 mg/dL (ref 6–20)
CO2: 28 mmol/L (ref 22–32)
Calcium: 9.1 mg/dL (ref 8.9–10.3)
Chloride: 99 mmol/L (ref 98–111)
Creatinine, Ser: 0.81 mg/dL (ref 0.61–1.24)
GFR, Estimated: >60 mL/min (ref >60)
Glucose, Bld: 139 mg/dL — ABNORMAL HIGH (ref 70–99)
Potassium: 4.1 mmol/L (ref 3.5–5.1)
Sodium: 136 mmol/L (ref 135–145)

## 2022-11-30 LAB — CBC
HCT: 37.1 % — ABNORMAL LOW (ref 39.0–52.0)
Hemoglobin: 11.8 g/dL — ABNORMAL LOW (ref 13.0–17.0)
MCH: 25.5 pg — ABNORMAL LOW (ref 26.0–34.0)
MCHC: 31.8 g/dL (ref 30.0–36.0)
MCV: 80.1 fL (ref 80.0–100.0)
Platelets: 389 10*3/uL (ref 150–400)
RBC: 4.63 MIL/uL (ref 4.22–5.81)
RDW: 13.5 % (ref 11.5–15.5)
WBC: 6.9 10*3/uL (ref 4.0–10.5)
nRBC: 0 % (ref 0.0–0.2)

## 2022-11-30 LAB — SURGICAL PCR SCREEN
MRSA, PCR: NEGATIVE
Staphylococcus aureus: POSITIVE — AB

## 2022-11-30 NOTE — Progress Notes (Addendum)
PCP - Ivin Booty Dettinger Cardiologist - Denies  PPM/ICD - Denies  Chest x-ray - N/I EKG - N/I Stress Test - Denies ECHO - Denies Cardiac Cath - Denies  Sleep Study - Yes has OSA CPAP - "I hate it, I don't use it"  DM - Denies  Blood Thinner Instructions:N/A Aspirin Instructions:N/A  COVID TEST- N/I  Patient denies any respiratory virus / illness in the last two months.   Anesthesia review: Yes abnormal EKG  Patient denies shortness of breath, fever, cough and chest pain at PAT appointment   All instructions explained to the patient, with a verbal understanding of the material. Patient agrees to go over the instructions while at home for a better understanding.The opportunity to ask questions was provided.  PATIENT INSTRUCTIONS UPDATED TO SHOW PATIENT COMING IN [employer] 12/12/22

## 2022-12-05 ENCOUNTER — Ambulatory Visit: Payer: Medicaid Other | Admitting: Family Medicine

## 2022-12-12 ENCOUNTER — Ambulatory Visit (HOSPITAL_COMMUNITY)
Admission: RE | Disposition: A | Discharge status: Home / Self Care | Payer: Self-pay | Source: Home / Self Care | Attending: Neurosurgery

## 2022-12-12 ENCOUNTER — Encounter (HOSPITAL_COMMUNITY): Payer: Self-pay | Admitting: Neurosurgery

## 2022-12-12 ENCOUNTER — Other Ambulatory Visit: Payer: Self-pay

## 2022-12-12 ENCOUNTER — Ambulatory Visit (HOSPITAL_COMMUNITY): Payer: Medicaid Other

## 2022-12-12 ENCOUNTER — Ambulatory Visit (HOSPITAL_COMMUNITY): Payer: Medicaid Other | Admitting: Certified Registered Nurse Anesthetist

## 2022-12-12 ENCOUNTER — Ambulatory Visit (HOSPITAL_BASED_OUTPATIENT_CLINIC_OR_DEPARTMENT_OTHER): Payer: Medicaid Other | Admitting: Certified Registered Nurse Anesthetist

## 2022-12-12 ENCOUNTER — Observation Stay (HOSPITAL_COMMUNITY)
Admission: RE | Admit: 2022-12-12 | Discharge: 2022-12-12 | Disposition: A | Discharge status: Home / Self Care | Payer: Medicaid Other | Attending: Neurosurgery | Admitting: Neurosurgery

## 2022-12-12 DIAGNOSIS — I10 Essential (primary) hypertension: Secondary | ICD-10-CM | POA: Diagnosis not present

## 2022-12-12 DIAGNOSIS — G959 Disease of spinal cord, unspecified: Secondary | ICD-10-CM

## 2022-12-12 DIAGNOSIS — M4802 Spinal stenosis, cervical region: Secondary | ICD-10-CM

## 2022-12-12 DIAGNOSIS — Z87891 Personal history of nicotine dependence: Secondary | ICD-10-CM | POA: Insufficient documentation

## 2022-12-12 DIAGNOSIS — G992 Myelopathy in diseases classified elsewhere: Secondary | ICD-10-CM | POA: Diagnosis present

## 2022-12-12 DIAGNOSIS — M50022 Cervical disc disorder at C5-C6 level with myelopathy: Principal | ICD-10-CM | POA: Insufficient documentation

## 2022-12-12 DIAGNOSIS — G4730 Sleep apnea, unspecified: Secondary | ICD-10-CM

## 2022-12-12 DIAGNOSIS — Z79899 Other long term (current) drug therapy: Secondary | ICD-10-CM | POA: Insufficient documentation

## 2022-12-12 HISTORY — PX: CERVICAL DISC ARTHROPLASTY: SHX587

## 2022-12-12 SURGERY — CERVICAL ANTERIOR DISC ARTHROPLASTY
Anesthesia: General

## 2022-12-12 MED ORDER — ROCURONIUM BROMIDE 10 MG/ML (PF) SYRINGE
PREFILLED_SYRINGE | INTRAVENOUS | Status: AC
  Filled 2022-12-12: qty 20

## 2022-12-12 MED ORDER — ACETAMINOPHEN 160 MG/5ML PO SOLN: 1000.0000 mg | Freq: Once | ORAL | Status: DC | PRN

## 2022-12-12 MED ORDER — ROCURONIUM BROMIDE 10 MG/ML (PF) SYRINGE
PREFILLED_SYRINGE | INTRAVENOUS | Status: DC | PRN
  Administered 2022-12-12: 30 mg via INTRAVENOUS
  Administered 2022-12-12: 70 mg via INTRAVENOUS
  Administered 2022-12-12: 30 mg via INTRAVENOUS

## 2022-12-12 MED ORDER — LACTATED RINGERS IV SOLN: INTRAVENOUS | Status: DC

## 2022-12-12 MED ORDER — BUPRENORPHINE HCL-NALOXONE HCL 8-2 MG SL SUBL
1.0000 | SUBLINGUAL_TABLET | Freq: Every day | SUBLINGUAL | Status: DC
  Administered 2022-12-12: 1 via SUBLINGUAL
  Filled 2022-12-12: qty 1

## 2022-12-12 MED ORDER — PHENOL 1.4 % MT LIQD: 1.0000 | OROMUCOSAL | Status: DC | PRN

## 2022-12-12 MED ORDER — PROPOFOL 1000 MG/100ML IV EMUL
INTRAVENOUS | Status: AC
  Filled 2022-12-12: qty 100

## 2022-12-12 MED ORDER — ACETAMINOPHEN 10 MG/ML IV SOLN
INTRAVENOUS | Status: DC | PRN
  Administered 2022-12-12: 1000 mg via INTRAVENOUS

## 2022-12-12 MED ORDER — CHLORHEXIDINE GLUCONATE CLOTH 2 % EX PADS: 6.0000 | MEDICATED_PAD | Freq: Once | CUTANEOUS | Status: DC

## 2022-12-12 MED ORDER — ACETAMINOPHEN 10 MG/ML IV SOLN: 1000.0000 mg | Freq: Once | INTRAVENOUS | Status: DC | PRN

## 2022-12-12 MED ORDER — THROMBIN (RECOMBINANT) 5000 UNITS EX SOLR
CUTANEOUS | Status: DC | PRN
  Administered 2022-12-12: 10 mL via TOPICAL

## 2022-12-12 MED ORDER — LIDOCAINE 2% (20 MG/ML) 5 ML SYRINGE
INTRAMUSCULAR | Status: DC | PRN
  Administered 2022-12-12: 50 mg via INTRAVENOUS

## 2022-12-12 MED ORDER — KETAMINE HCL 50 MG/5ML IJ SOSY
PREFILLED_SYRINGE | INTRAMUSCULAR | Status: AC
  Filled 2022-12-12: qty 10

## 2022-12-12 MED ORDER — SODIUM CHLORIDE 0.9 % IV SOLN: 250.0000 mL | INTRAVENOUS | Status: DC

## 2022-12-12 MED ORDER — LISINOPRIL 20 MG PO TABS
20.0000 mg | ORAL_TABLET | Freq: Every day | ORAL | Status: DC
  Administered 2022-12-12: 20 mg via ORAL
  Filled 2022-12-12: qty 1

## 2022-12-12 MED ORDER — BISACODYL 10 MG RE SUPP: 10.0000 mg | Freq: Every day | RECTAL | Status: DC | PRN

## 2022-12-12 MED ORDER — ACETAMINOPHEN 500 MG PO TABS: 1000.0000 mg | ORAL_TABLET | Freq: Once | ORAL | Status: DC | PRN

## 2022-12-12 MED ORDER — SENNA 8.6 MG PO TABS
1.0000 | ORAL_TABLET | Freq: Two times a day (BID) | ORAL | Status: DC
  Administered 2022-12-12: 8.6 mg via ORAL
  Filled 2022-12-12: qty 1

## 2022-12-12 MED ORDER — SODIUM CHLORIDE 0.9 % IV SOLN: INTRAVENOUS | Status: DC

## 2022-12-12 MED ORDER — PROPOFOL 10 MG/ML IV BOLUS
INTRAVENOUS | Status: AC
  Filled 2022-12-12: qty 20

## 2022-12-12 MED ORDER — DEXAMETHASONE SODIUM PHOSPHATE 10 MG/ML IJ SOLN
INTRAMUSCULAR | Status: DC | PRN
  Administered 2022-12-12: 10 mg via INTRAVENOUS

## 2022-12-12 MED ORDER — THROMBIN 5000 UNITS EX SOLR
OROMUCOSAL | Status: DC | PRN
  Administered 2022-12-12: 5 mL via TOPICAL

## 2022-12-12 MED ORDER — ORAL CARE MOUTH RINSE: 15.0000 mL | Freq: Once | OROMUCOSAL | Status: AC

## 2022-12-12 MED ORDER — PROPOFOL 10 MG/ML IV BOLUS
INTRAVENOUS | Status: DC | PRN
  Administered 2022-12-12: 10 mg via INTRAVENOUS
  Administered 2022-12-12: 200 mg via INTRAVENOUS

## 2022-12-12 MED ORDER — FLEET ENEMA 7-19 GM/118ML RE ENEM: 1.0000 | ENEMA | Freq: Once | RECTAL | Status: DC | PRN

## 2022-12-12 MED ORDER — DOCUSATE SODIUM 100 MG PO CAPS
100.0000 mg | ORAL_CAPSULE | Freq: Two times a day (BID) | ORAL | Status: DC
  Administered 2022-12-12: 100 mg via ORAL
  Filled 2022-12-12: qty 1

## 2022-12-12 MED ORDER — LIDOCAINE 2% (20 MG/ML) 5 ML SYRINGE
INTRAMUSCULAR | Status: AC
  Filled 2022-12-12: qty 5

## 2022-12-12 MED ORDER — POLYETHYLENE GLYCOL 3350 17 G PO PACK: 17.0000 g | PACK | Freq: Every day | ORAL | Status: DC | PRN

## 2022-12-12 MED ORDER — SODIUM CHLORIDE 0.9% FLUSH: 3.0000 mL | INTRAVENOUS | Status: DC | PRN

## 2022-12-12 MED ORDER — FENTANYL CITRATE (PF) 250 MCG/5ML IJ SOLN
INTRAMUSCULAR | Status: AC
  Filled 2022-12-12: qty 5

## 2022-12-12 MED ORDER — ONDANSETRON HCL 4 MG/2ML IJ SOLN
INTRAMUSCULAR | Status: DC | PRN
  Administered 2022-12-12: 4 mg via INTRAVENOUS

## 2022-12-12 MED ORDER — DEXAMETHASONE SODIUM PHOSPHATE 10 MG/ML IJ SOLN
INTRAMUSCULAR | Status: AC
  Filled 2022-12-12: qty 1

## 2022-12-12 MED ORDER — ACETAMINOPHEN 10 MG/ML IV SOLN
INTRAVENOUS | Status: AC
  Filled 2022-12-12: qty 100

## 2022-12-12 MED ORDER — CEFAZOLIN IN SODIUM CHLORIDE 3-0.9 GM/100ML-% IV SOLN
3.0000 g | INTRAVENOUS | Status: AC
  Administered 2022-12-12: 3 g via INTRAVENOUS
  Filled 2022-12-12: qty 100

## 2022-12-12 MED ORDER — SUGAMMADEX SODIUM 200 MG/2ML IV SOLN
INTRAVENOUS | Status: DC | PRN
  Administered 2022-12-12: 200 mg via INTRAVENOUS

## 2022-12-12 MED ORDER — KETAMINE HCL 10 MG/ML IJ SOLN
INTRAMUSCULAR | Status: DC | PRN
  Administered 2022-12-12 (×2): 10 mg via INTRAVENOUS
  Administered 2022-12-12: 50 mg via INTRAVENOUS

## 2022-12-12 MED ORDER — PANTOPRAZOLE SODIUM 40 MG IV SOLR: 40.0000 mg | Freq: Every day | INTRAVENOUS | Status: DC

## 2022-12-12 MED ORDER — OXYCODONE HCL 5 MG PO TABS: 5.0000 mg | ORAL_TABLET | Freq: Once | ORAL | Status: DC | PRN

## 2022-12-12 MED ORDER — ACETAMINOPHEN 325 MG PO TABS: 650.0000 mg | ORAL_TABLET | ORAL | Status: DC | PRN

## 2022-12-12 MED ORDER — MIDAZOLAM HCL 2 MG/2ML IJ SOLN
INTRAMUSCULAR | Status: AC
  Filled 2022-12-12: qty 2

## 2022-12-12 MED ORDER — MIDAZOLAM HCL 2 MG/2ML IJ SOLN
INTRAMUSCULAR | Status: DC | PRN
  Administered 2022-12-12: 2 mg via INTRAVENOUS

## 2022-12-12 MED ORDER — THROMBIN 5000 UNITS EX SOLR
CUTANEOUS | Status: AC
  Filled 2022-12-12: qty 15000

## 2022-12-12 MED ORDER — SUCCINYLCHOLINE CHLORIDE 200 MG/10ML IV SOSY
PREFILLED_SYRINGE | INTRAVENOUS | Status: DC | PRN
  Administered 2022-12-12: 140 mg via INTRAVENOUS

## 2022-12-12 MED ORDER — 0.9 % SODIUM CHLORIDE (POUR BTL) OPTIME
TOPICAL | Status: DC | PRN
  Administered 2022-12-12: 1000 mL

## 2022-12-12 MED ORDER — OXYCODONE HCL 5 MG/5ML PO SOLN: 5.0000 mg | Freq: Once | ORAL | Status: DC | PRN

## 2022-12-12 MED ORDER — SODIUM CHLORIDE 0.9% FLUSH
3.0000 mL | Freq: Two times a day (BID) | INTRAVENOUS | Status: DC
  Administered 2022-12-12: 3 mL via INTRAVENOUS

## 2022-12-12 MED ORDER — ACETAMINOPHEN 650 MG RE SUPP: 650.0000 mg | RECTAL | Status: DC | PRN

## 2022-12-12 MED ORDER — CHLORHEXIDINE GLUCONATE 0.12 % MT SOLN
15.0000 mL | Freq: Once | OROMUCOSAL | Status: AC
  Administered 2022-12-12: 15 mL via OROMUCOSAL
  Filled 2022-12-12: qty 15

## 2022-12-12 MED ORDER — FENTANYL CITRATE (PF) 250 MCG/5ML IJ SOLN
INTRAMUSCULAR | Status: DC | PRN
  Administered 2022-12-12 (×3): 50 ug via INTRAVENOUS
  Administered 2022-12-12: 100 ug via INTRAVENOUS
  Administered 2022-12-12 (×3): 50 ug via INTRAVENOUS

## 2022-12-12 MED ORDER — FENTANYL CITRATE (PF) 100 MCG/2ML IJ SOLN: 25.0000 ug | INTRAMUSCULAR | Status: DC | PRN

## 2022-12-12 MED ORDER — LIDOCAINE-EPINEPHRINE 1 %-1:100000 IJ SOLN
INTRAMUSCULAR | Status: DC | PRN
  Administered 2022-12-12: 5 mL

## 2022-12-12 MED ORDER — BUPIVACAINE HCL (PF) 0.5 % IJ SOLN
INTRAMUSCULAR | Status: AC
  Filled 2022-12-12: qty 30

## 2022-12-12 MED ORDER — ONDANSETRON HCL 4 MG PO TABS: 4.0000 mg | ORAL_TABLET | Freq: Four times a day (QID) | ORAL | Status: DC | PRN

## 2022-12-12 MED ORDER — SUCCINYLCHOLINE CHLORIDE 200 MG/10ML IV SOSY
PREFILLED_SYRINGE | INTRAVENOUS | Status: AC
  Filled 2022-12-12: qty 10

## 2022-12-12 MED ORDER — METHOCARBAMOL 1000 MG/10ML IJ SOLN
500.0000 mg | Freq: Four times a day (QID) | INTRAVENOUS | Status: DC | PRN
  Filled 2022-12-12: qty 5

## 2022-12-12 MED ORDER — MENTHOL 3 MG MT LOZG: 1.0000 | LOZENGE | OROMUCOSAL | Status: DC | PRN

## 2022-12-12 MED ORDER — ONDANSETRON HCL 4 MG/2ML IJ SOLN: 4.0000 mg | Freq: Four times a day (QID) | INTRAMUSCULAR | Status: DC | PRN

## 2022-12-12 MED ORDER — CEFAZOLIN SODIUM-DEXTROSE 2-4 GM/100ML-% IV SOLN
2.0000 g | Freq: Three times a day (TID) | INTRAVENOUS | Status: DC
  Administered 2022-12-12: 2 g via INTRAVENOUS
  Filled 2022-12-12: qty 100

## 2022-12-12 MED ORDER — BUPIVACAINE HCL 0.5 % IJ SOLN
INTRAMUSCULAR | Status: DC | PRN
  Administered 2022-12-12: 5 mL

## 2022-12-12 MED ORDER — ONDANSETRON HCL 4 MG/2ML IJ SOLN
INTRAMUSCULAR | Status: AC
  Filled 2022-12-12: qty 2

## 2022-12-12 MED ORDER — METHOCARBAMOL 500 MG PO TABS: 500.0000 mg | ORAL_TABLET | Freq: Four times a day (QID) | ORAL | Status: DC | PRN

## 2022-12-12 MED ORDER — LIDOCAINE-EPINEPHRINE 1 %-1:100000 IJ SOLN
INTRAMUSCULAR | Status: AC
  Filled 2022-12-12: qty 1

## 2022-12-12 MED ORDER — KETOROLAC TROMETHAMINE 30 MG/ML IJ SOLN
INTRAMUSCULAR | Status: DC | PRN
  Administered 2022-12-12: 30 mg via INTRAVENOUS

## 2022-12-12 SURGICAL SUPPLY — 59 items
ADH SKN CLS APL DERMABOND .7 (GAUZE/BANDAGES/DRESSINGS) ×1
ADH SKN CLS LQ APL DERMABOND (GAUZE/BANDAGES/DRESSINGS) ×1
APL SKNCLS STERI-STRIP NONHPOA (GAUZE/BANDAGES/DRESSINGS)
BAG COUNTER SPONGE SURGICOUNT (BAG) ×1 IMPLANT
BAG SPNG CNTER NS LX DISP (BAG) ×1
BAND INSRT 18 STRL LF DISP RB (MISCELLANEOUS) ×2
BAND RUBBER #18 3X1/16 STRL (MISCELLANEOUS) ×2 IMPLANT
BENZOIN TINCTURE PRP APPL 2/3 (GAUZE/BANDAGES/DRESSINGS) IMPLANT
BLADE CLIPPER SURG (BLADE) IMPLANT
BLADE SURG 11 STRL SS (BLADE) ×1 IMPLANT
BLADE ULTRA TIP 2M (BLADE) IMPLANT
BUR MATCHSTICK NEURO 3.0 LAGG (BURR) ×1 IMPLANT
CANISTER SUCT 3000ML PPV (MISCELLANEOUS) ×1 IMPLANT
DERMABOND ADVANCED .7 DNX12 (GAUZE/BANDAGES/DRESSINGS) ×1 IMPLANT
DERMABOND ADVANCED .7 DNX6 (GAUZE/BANDAGES/DRESSINGS) IMPLANT
DISC MOBI-C CERVICAL 17X17 H5 (Screw) IMPLANT
DRAPE C-ARM 42X72 X-RAY (DRAPES) ×2 IMPLANT
DRAPE C-ARMOR (DRAPES) IMPLANT
DRAPE HALF SHEET 40X57 (DRAPES) IMPLANT
DRAPE LAPAROTOMY 100X72 PEDS (DRAPES) ×1 IMPLANT
DRAPE MICROSCOPE SLANT 54X150 (MISCELLANEOUS) ×1 IMPLANT
DRSG OPSITE 4X5.5 SM (GAUZE/BANDAGES/DRESSINGS) ×2 IMPLANT
DRSG OPSITE POSTOP 3X4 (GAUZE/BANDAGES/DRESSINGS) IMPLANT
DURAPREP 6ML APPLICATOR 50/CS (WOUND CARE) ×1 IMPLANT
ELECT COATED BLADE 2.86 ST (ELECTRODE) ×1 IMPLANT
ELECT REM PT RETURN 9FT ADLT (ELECTROSURGICAL) ×1
ELECTRODE REM PT RTRN 9FT ADLT (ELECTROSURGICAL) ×1 IMPLANT
GAUZE 4X4 16PLY ~~LOC~~+RFID DBL (SPONGE) IMPLANT
GLOVE BIOGEL PI IND STRL 7.5 (GLOVE) ×1 IMPLANT
GLOVE ECLIPSE 7.0 STRL STRAW (GLOVE) ×2 IMPLANT
GLOVE EXAM NITRILE XL STR (GLOVE) IMPLANT
GOWN STRL REUS W/ TWL LRG LVL3 (GOWN DISPOSABLE) ×2 IMPLANT
GOWN STRL REUS W/ TWL XL LVL3 (GOWN DISPOSABLE) IMPLANT
GOWN STRL REUS W/TWL 2XL LVL3 (GOWN DISPOSABLE) IMPLANT
GOWN STRL REUS W/TWL LRG LVL3 (GOWN DISPOSABLE) ×1
GOWN STRL REUS W/TWL XL LVL3 (GOWN DISPOSABLE) ×1
HEMOSTAT POWDER KIT SURGIFOAM (HEMOSTASIS) ×1 IMPLANT
KIT BASIN OR (CUSTOM PROCEDURE TRAY) ×1 IMPLANT
KIT TURNOVER KIT B (KITS) ×1 IMPLANT
NDL SPNL 22GX3.5 QUINCKE BK (NEEDLE) ×1 IMPLANT
NEEDLE HYPO 22GX1.5 SAFETY (NEEDLE) ×1 IMPLANT
NEEDLE SPNL 22GX3.5 QUINCKE BK (NEEDLE) ×1 IMPLANT
NS IRRIG 1000ML POUR BTL (IV SOLUTION) ×1 IMPLANT
PACK LAMINECTOMY NEURO (CUSTOM PROCEDURE TRAY) ×1 IMPLANT
PAD ARMBOARD 7.5X6 YLW CONV (MISCELLANEOUS) ×3 IMPLANT
PIN DISTRACTION 14MM (PIN) IMPLANT
SOL ELECTROSURG ANTI STICK (MISCELLANEOUS) ×1
SOLUTION ELECTROSURG ANTI STCK (MISCELLANEOUS) ×1 IMPLANT
SPIKE FLUID TRANSFER (MISCELLANEOUS) ×2 IMPLANT
SPONGE INTESTINAL PEANUT (DISPOSABLE) ×1 IMPLANT
SPONGE SURGIFOAM ABS GEL SZ50 (HEMOSTASIS) ×1 IMPLANT
STAPLER VISISTAT 35W (STAPLE) ×1 IMPLANT
STRIP CLOSURE SKIN 1/2X4 (GAUZE/BANDAGES/DRESSINGS) IMPLANT
SUT VIC AB 3-0 SH 8-18 (SUTURE) ×1 IMPLANT
SUT VICRYL 3-0 RB1 18 ABS (SUTURE) ×2 IMPLANT
TAPE CLOTH 3X10 TAN LF (GAUZE/BANDAGES/DRESSINGS) ×1 IMPLANT
TOWEL GREEN STERILE (TOWEL DISPOSABLE) ×1 IMPLANT
TOWEL GREEN STERILE FF (TOWEL DISPOSABLE) ×1 IMPLANT
WATER STERILE IRR 1000ML POUR (IV SOLUTION) ×1 IMPLANT

## 2022-12-12 NOTE — Anesthesia Procedure Notes (Signed)
Procedure Name: Intubation Date/Time: 12/12/2022 8:12 AM  Performed by: Lonia Mad, CRNAPre-anesthesia Checklist: Patient identified, Emergency Drugs available, Suction available and Patient being monitored Patient Re-evaluated:Patient Re-evaluated prior to induction Oxygen Delivery Method: Circle System Utilized Preoxygenation: Pre-oxygenation with 100% oxygen Induction Type: IV induction Ventilation: Mask ventilation with difficulty, Oral airway inserted - appropriate to patient size and Two handed mask ventilation required Laryngoscope Size: Mac, Glidescope and 4 Grade View: Grade I Tube type: Oral Tube size: 7.5 mm Number of attempts: 1 Airway Equipment and Method: Stylet Placement Confirmation: ETT inserted through vocal cords under direct vision, positive ETCO2 and breath sounds checked- equal and bilateral Secured at: 23 cm Tube secured with: Tape Dental Injury: Teeth and Oropharynx as per pre-operative assessment  Difficulty Due To: Difficulty was anticipated, Difficult Airway- due to large tongue, Difficult Airway- due to reduced neck mobility, Difficult Airway- due to limited oral opening and Difficult Airway-  due to edematous airway Future Recommendations: Recommend- induction with short-acting agent, and alternative techniques readily available Comments: Pt states myelopathy symptoms do not worsen with neck movement.  Smooth IV induction, head/neck remain in midline/neutral position of comfort per pt for induction and intubation. CTM

## 2022-12-12 NOTE — Progress Notes (Signed)
Patient alert and oriented, voiding adequately, MAE well with no difficulty. Incision area cdi with no s/s of infection. Patient discharged home per order. Patient and girlfriend stated understanding of discharge instructions given. Patient has an appointment with Dr Conchita Paris in 2 weeks

## 2022-12-12 NOTE — Evaluation (Signed)
Occupational Therapy Evaluation Patient Details Name: Darrell Henry MRN: [id number] DOB: 01-12-1986 Today's Date: 12/12/2022   History of Present Illness 37 yo M s/p ACDF.  PMH includes: HTN   Clinical Impression   Patient admitted for the procedure above.  PTA he lives with his SO, and she was assisting with ADL completion and community mobility.  Patient is very pleased with the outcome of his surgery, he feels his BUE's are stronger and his coordination is improving.  Patient was able to complete ADL with up to Min A, and mobility in the hall was close supervision.  No significant OT needs exist in the acute setting, all precautions reviewed, and patient verbalizes understanding.  Recommend follow up with MD as prescribed.  Patient would probably benefit from outpatient PT once cleared by surgeon.        Recommendations for follow up therapy are one component of a multi-disciplinary discharge planning process, led by the attending physician.  Recommendations may be updated based on patient status, additional functional criteria and insurance authorization.   Assistance Recommended at Discharge Set up Supervision/Assistance  Patient can return home with the following Assist for transportation;Assistance with cooking/housework    Functional Status Assessment  Patient has had a recent decline in their functional status and demonstrates the ability to make significant improvements in function in a reasonable and predictable amount of time.  Equipment Recommendations  None recommended by OT    Recommendations for Other Services       Precautions / Restrictions Precautions Precautions: Cervical Precaution Booklet Issued: Yes (comment) Precaution Comments: verbalized understanding Restrictions Weight Bearing Restrictions: No      Mobility Bed Mobility Overal bed mobility: Modified Independent                  Transfers Overall transfer level: Modified independent                  General transfer comment: Patient with compensatory gait, a little stiff legged and getting forward on the balls of his feet, but no LOB.  Able to climb 4 stairs.      Balance Overall balance assessment: Needs assistance Sitting-balance support: Feet supported Sitting balance-Leahy Scale: Good     Standing balance support: No upper extremity supported Standing balance-Leahy Scale: Fair                             ADL either performed or assessed with clinical judgement   ADL                                         General ADL Comments: Close supervision to Mod I for dressing task, and Mod I toileting.     Vision Patient Visual Report: No change from baseline       Perception     Praxis      Pertinent Vitals/Pain Pain Assessment Pain Assessment: Faces Faces Pain Scale: Hurts a little bit Pain Location: Incisional Pain Descriptors / Indicators: Tender Pain Intervention(s): Monitored during session     Hand Dominance Right   Extremity/Trunk Assessment Upper Extremity Assessment Upper Extremity Assessment: Generalized weakness;RUE deficits/detail;LUE deficits/detail RUE Deficits / Details: 100 degress shoulder flexion.  3+/5 grossly to RUE strength.  Fair grasp. RUE Sensation: WNL RUE Coordination: decreased fine motor LUE Deficits / Details: 100 degress shoulder flexion. 3+/5 grossly to  RUE strength. Fair grasp. LUE Sensation: WNL LUE Coordination: WNL   Lower Extremity Assessment Lower Extremity Assessment: Defer to PT evaluation   Cervical / Trunk Assessment Cervical / Trunk Assessment: Neck Surgery   Communication Communication Communication: No difficulties   Cognition Arousal/Alertness: Awake/alert Behavior During Therapy: WFL for tasks assessed/performed Overall Cognitive Status: Within Functional Limits for tasks assessed                                       General Comments   VSS on  RA    Exercises     Shoulder Instructions      Home Living Family/patient expects to be discharged to:: Private residence Living Arrangements: Spouse/significant other;Children Available Help at Discharge: Family;Available 24 hours/day Type of Home: House Home Access: Stairs to enter [employer] of Steps: 4 Entrance Stairs-Rails: Left Home Layout: One level     Bathroom Shower/Tub: Chief Strategy Officer: Standard Bathroom Accessibility: Yes How Accessible: Accessible via walker Home Equipment: None          Prior Functioning/Environment Prior Level of Function : Independent/Modified Independent;Driving;History of Falls (last six months)                        OT Problem List: Decreased strength;Impaired balance (sitting and/or standing);Decreased cognition;Pain      OT Treatment/Interventions:      OT Goals(Current goals can be found in the care plan section) Acute Rehab OT Goals Patient Stated Goal: Return home today OT Goal Formulation: With patient Time For Goal Achievement: 12/16/22 Potential to Achieve Goals: Good  OT Frequency:      Co-evaluation              AM-PAC OT "6 Clicks" Daily Activity     Outcome Measure Help from another person eating meals?: None Help from another person taking care of personal grooming?: None Help from another person toileting, which includes using toliet, bedpan, or urinal?: None Help from another person bathing (including washing, rinsing, drying)?: A Little Help from another person to put on and taking off regular upper body clothing?: None Help from another person to put on and taking off regular lower body clothing?: A Little 6 Click Score: 22   End of Session Equipment Utilized During Treatment: Gait belt Nurse Communication: Mobility status  Activity Tolerance: Patient tolerated treatment well Patient left: in bed;with call bell/phone within reach;with family/visitor  present  OT Visit Diagnosis: Unsteadiness on feet (R26.81);Muscle weakness (generalized) (M62.81)                Time: 1610-9604 OT Time Calculation (min): 22 min Charges:  OT General Charges $OT Visit: 1 Visit OT Evaluation $OT Eval Moderate Complexity: 1 Mod  12/12/2022  RP, OTR/L  Acute Rehabilitation Services  Office:  [phone]   Suzanna Obey 12/12/2022, 2:44 PM

## 2022-12-12 NOTE — H&P (Signed)
Chief Complaint   No chief complaint on file.   History of Present Illness  Darrell Henry is a 37 year old man seen for initial consultation at the request of Dr. Marjory Lies. He is referred for evaluation of difficulty walking and upper extremity weakness. Patient began having symptoms nearly two years ago, where MRI demonstrated a relatively large C5-6 disc herniation. Unfortunately at the time he was moving to the [place] area. He tells me that he was not able to find anybody that took his insurance. He has moved back to the [place] area and notes that over the last 6-9 months he has had worsening of the symptoms. He describes significant gait instability. He notes weakness in his legs, especially when he is trying to stand up from a seated position. In addition, he reports neck pain, and upper extremity weakness. He has noted decreased range of motion in the shoulders, with inability to raise his arms above his head. He is also noted difficulty extending the fingers in both his hands. He has been dropping things especially with his right hand.  Of note, the patient reports a history of hypertension and prediabetes. No history of heart disease or previous stroke. No known liver or kidney disease. The patient does report a history of sleep apnea and has been prescribed a CPAP machine but does not use it at night. He is not on any blood thinners or antiplatelet agents. 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    Past Surgical History  History reviewed. No pertinent surgical history.  Social History   Social History   Tobacco Use   Smoking status: Former    Years: 15    Types: Cigarettes, E-cigarettes    Quit date: 05/03/2012    Years since quitting: 10.6    Smokeless tobacco: Never   Tobacco comments:    05/31/21 E CIG vape   Vaping Use   Vaping Use: Every day  Substance Use Topics   Alcohol use: No   Drug use: No    Comment: previous heroine addiction and narcotic addiction; Suboxone since 2013.    Medications   Prior to Admission medications   Medication Sig Start Date End Date Taking? Authorizing Provider  Buprenorphine HCl-Naloxone HCl 8-2 MG FILM Take 1 Film by mouth daily. 03/16/16  Yes [provider]  CALCIUM-MAGNESIUM-ZINC PO Take 1 tablet by mouth daily.   Yes [provider]  lisinopril (ZESTRIL) 20 MG tablet Take 1 tablet (20 mg total) by mouth daily. 09/29/22  Yes Dettinger, Elige Radon, MD    Allergies  No Known Allergies  Review of Systems  ROS  Neurologic Exam  Awake, alert, oriented Memory and concentration grossly intact Speech fluent, appropriate CN grossly intact Motor exam: Upper Extremities Deltoid Bicep Tricep Grip  Right 4/5 -->     Left 4/5 -->      Lower Extremities IP Quad PF DF EHL  Right 4/5 -->      Left 4/5 -->       Sensation grossly intact to LT  Imaging  MRI reveals large C5-6 disc herniation with associated cord signal change  Impression  - 37 y.o. 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 We have also reviewed in detail the risks, benefits, and alternatives to the procedure. All questions were answered and Darrell Henry provided informed consent to proceed.  Lisbeth Renshaw, MD [facility]

## 2022-12-12 NOTE — Plan of Care (Signed)

## 2022-12-12 NOTE — Anesthesia Postprocedure Evaluation (Signed)
Anesthesia Post Note  Patient: Darrell Henry  Procedure(s) Performed: ANTERIOR CERVICAL DISC ARTHROPLASTY CERVICAL FIVE-SIX     Patient location during evaluation: PACU Anesthesia Type: General Level of consciousness: awake and alert Pain management: pain level controlled Vital Signs Assessment: post-procedure vital signs reviewed and stable Respiratory status: spontaneous breathing, nonlabored ventilation and respiratory function stable Cardiovascular status: blood pressure returned to baseline and stable Postop Assessment: no apparent nausea or vomiting Anesthetic complications: yes   Encounter Notable Events  Notable Event Outcome Phase Comment  Difficult to intubate - expected  Intraprocedure Filed from anesthesia note documentation.    Last Vitals:  Vitals:   12/12/22 1059 12/12/22 1116  BP:  139/85  Pulse: (!) 110 (!) 110  Resp: 12 20  Temp:  37.1 C  SpO2: 95% 98%    Last Pain:  Vitals:   12/12/22 1116  TempSrc: Oral  PainSc:                  Charlee Whitebread

## 2022-12-12 NOTE — Discharge Summary (Signed)
  Physician Discharge Summary  Patient ID: Darrell Henry MRN: [id number] DOB/AGE: 1986/05/29 36 y.o.  Admit date: 12/12/2022 Discharge date: 12/12/2022  Admission Diagnoses:  Cervical stenosis with myelopathy  Discharge Diagnoses:  Same Principal Problem:   Stenosis of cervical spine with myelopathy [facility])   Discharged Condition: Stable  Hospital Course:  Sante Biedermann is a 37 y.o. male admitted after C5-6 disc arthroplasty. He was at baseline postop, ambulating, tolerating diet. Pain was controlled with his home dose of suboxone. He requested d/c.  Treatments: Surgery - C5-6 disc arthroplasty  Discharge Exam: Blood pressure (!) 149/73, pulse (!) 111, temperature 97.8 F (36.6 C), temperature source Oral, resp. rate 20, height 6' (1.829 m), weight (!) 141.5 kg, SpO2 98 %. Awake, alert, oriented Speech fluent, appropriate CN grossly intact 4/5 BUE/BLE Wound c/d/i  Disposition: Discharge disposition: 01-Home or Self Care       Discharge Instructions     Call MD for:  redness, tenderness, or signs of infection (pain, swelling, redness, odor or green/yellow discharge around incision site)   Complete by: As directed    Call MD for:  temperature >100.4   Complete by: As directed    Diet - low sodium heart healthy   Complete by: As directed    Discharge instructions   Complete by: As directed    Walk at home as much as possible, at least 4 times / day   Incentive spirometry RT   Complete by: As directed    Increase activity slowly   Complete by: As directed    Lifting restrictions   Complete by: As directed    No lifting > 10 lbs   May shower / Bathe   Complete by: As directed    48 hours after surgery   May walk up steps   Complete by: As directed    Other Restrictions   Complete by: As directed    No bending/twisting at waist   Remove dressing in 48 hours   Complete by: As directed       Allergies as of 12/12/2022   No Known Allergies       Medication List     TAKE these medications    Buprenorphine HCl-Naloxone HCl 8-2 MG Film Take 1 Film by mouth daily.   CALCIUM-MAGNESIUM-ZINC PO Take 1 tablet by mouth daily.   lisinopril 20 MG tablet Commonly known as: ZESTRIL Take 1 tablet (20 mg total) by mouth daily.        Follow-up Information     Lisbeth Renshaw, MD. Call.   Specialty: Neurosurgery Why: As needed, If symptoms worsen Contact information: [address] [place] [address] [phone]                 Signed: Jackelyn Hoehn 12/12/2022, 4:02 PM

## 2022-12-12 NOTE — Discharge Instructions (Signed)
Wound Care: Remove dressing in 3 days Leave incision open to air. You may shower. Do not scrub directly on incision.  Do not put any creams, lotions, or ointments on incision. Activity Walk each and every day, increasing distance each day. No lifting greater than 5 lbs.  Avoid excessive neck motion. No driving for 2 weeks; may ride as a passenger locally.  Diet Resume your normal diet.  Return to Work Will be discussed at you follow up appointment. Call Your Doctor If Any of These Occur Redness, drainage, or swelling at the wound.  Temperature greater than 101 degrees. Severe pain not relieved by pain medication. Increased difficulty swallowing. Incision starts to come apart. Follow Up Appt CalL ([phone])  for problems.  If you have any hardware placed in your spine, you will need an x-ray before your appointment.

## 2022-12-12 NOTE — Transfer of Care (Signed)
Immediate Anesthesia Transfer of Care Note  Patient: Darrell Henry  Procedure(s) Performed: ANTERIOR CERVICAL DISC ARTHROPLASTY CERVICAL FIVE-SIX  Patient Location: PACU  Anesthesia Type:General  Level of Consciousness: awake, alert , oriented, patient cooperative, and responds to stimulation  Airway & Oxygen Therapy: Patient Spontanous Breathing, Patient connected to nasal cannula oxygen, and Patient connected to face mask oxygen  Post-op Assessment: Report given to RN and Post -op Vital signs reviewed and stable  Post vital signs: Reviewed and stable  Last Vitals:  Vitals Value Taken Time  BP 154/99 12/12/22 1018  Temp    Pulse 111 12/12/22 1022  Resp 13 12/12/22 1022  SpO2 99 % 12/12/22 1022  Vitals shown include unvalidated device data.  Last Pain:  Vitals:   12/12/22 0649  TempSrc:   PainSc: 3          Complications:  Encounter Notable Events  Notable Event Outcome Phase Comment  Difficult to intubate - expected  Intraprocedure Filed from anesthesia note documentation.

## 2022-12-12 NOTE — Op Note (Signed)
NEUROSURGERY OPERATIVE NOTE   PREOP DIAGNOSIS: Cervical stenosis with myelopathy, C5-6  POSTOP DIAGNOSIS: Same  PROCEDURE: 1. Discectomy at C5-6 for decompression of spinal cord and exiting nerve roots  2. Placement of intervertebral disc arthroplasty device - ZimVie 15 x 17 x 5mm Mobi-C 3.. Use of intraoperative microscope  SURGEON: Dr. Lisbeth Renshaw, MD  ASSISTANT: Dr. Julio Sicks, MD  ANESTHESIA: General Endotracheal  EBL: Minimalcc  SPECIMENS: None  DRAINS: None  COMPLICATIONS: None immediate  CONDITION: Hemodynamically stable to PACU  HISTORY: Darrell Henry is a 37 y.o. y.o. male who initially presented to the outpatient clinic with signs and symptoms consistent with cervical myelopathy including bilateral upper extremity weakness and difficulty walking. MRI demonstrated relatively large, broad-based disc herniation at C5-6 with associated spinal cord compression and T2 signal change.. Treatment options were discussed including my recommendation for operative decompression.  With the patient's young age and lack of significant spondylosis elsewhere in the cervical spine I believe he was a candidate for disc arthroplasty.  We discussed the details of the procedure as well as the associated risks, benefits, and alternatives and the expected postoperative course and recovery.  After all questions were answered, informed consent was obtained.  PROCEDURE IN DETAIL: The patient was brought to the operating room and transferred to the operative table. After induction of general anesthesia, the patient was positioned on the operative table in the supine position with all pressure points meticulously padded. The skin of the neck was then prepped and draped in the usual sterile fashion.  After timeout was conducted, the skin was infiltrated with local anesthetic. Skin incision was then made sharply and Bovie electrocautery was used to dissect the subcutaneous tissue until the  platysma was identified. The platysma was then divided and undermined. The sternocleidomastoid muscle was then identified and, utilizing natural fascial planes in the neck, the prevertebral fascia was identified and the carotid sheath was retracted laterally and the trachea and esophagus retracted medially. Again using fluoroscopy, the correct disc space was identified. Bovie electrocautery was used to dissect in the subperiosteal plane and elevate the bilateral longus coli muscles. Self-retaining retractors were then placed.  Under AP fluoroscopy, Caspari retractor pins were placed in the C5 and C6 vertebral bodies in the midline.  At this point, the microscope was draped and brought into the field, and the remainder of the case was done under the microscope using microdissectinqg technique.   The disc space was incised sharply and rongeurs were use to initially complete a discectomy. The high-speed drill was then used to complete discectomy until the posterior annulus was identified and removed and the posterior longitudinal ligament was identified. Using a nerve hook, the PLL was elevated, and Kerrison rongeurs were used to remove the posterior longitudinal ligament and the ventral thecal sac was identified. Using a combination of curettes and Kerrison punches, complete decompression of the thecal sac and exiting nerve roots at this level was completed.  I did visualize the proximal portion of the exiting C6 nerve roots.  Having completed our decompression, attention was turned to placement of the intervertebral device. Trial spacers were used to select a 15 x 17 x 5mm graft.  The implant was then inserted under fluoroscopic guidance.  Final AP and lateral images did confirm good location of the implanted device.  At this point, after all counts were verified to be correct, meticulous hemostasis was secured using a combination of bipolar electrocautery and passive hemostatics. The platysma muscle was then  closed using  interrupted 3-0 Vicryl sutures, and the skin was closed with an interrupted 3-0 Vicry subcuticular stitch. Dermabond and sterile dressings were then applied and the drapes removed.  The patient tolerated the procedure well and was extubated in the room and taken to the postanesthesia care unit in stable condition.   Lisbeth Renshaw, MD [facility]

## 2022-12-12 NOTE — Anesthesia Preprocedure Evaluation (Signed)
Anesthesia Evaluation  Patient identified by MRN, date of birth, ID band Patient awake    Reviewed: Allergy & Precautions, NPO status , Patient's Chart, lab work & pertinent test results  History of Anesthesia Complications Negative for: history of anesthetic complications  Airway Mallampati: III  TM Distance: >3 FB Neck ROM: Full    Dental  (+) Dental Advisory Given   Pulmonary neg shortness of breath, sleep apnea , neg COPD, neg recent URI, former smoker   breath sounds clear to auscultation       Cardiovascular hypertension, Pt. on medications (-) angina (-) Past MI and (-) CHF  Rhythm:Regular     Neuro/Psych : MYELOPATHY CONCURRENT WITH AND DUE TO SPINAL STENOSIS OF CERVICAL REGION  Neuromuscular disease    GI/Hepatic negative GI ROS, Neg liver ROS,,,  Endo/Other    Morbid obesity  Renal/GU negative Renal ROS     Musculoskeletal negative musculoskeletal ROS (+)    Abdominal   Peds  Hematology  (+) Blood dyscrasia, anemia Lab Results      Component                Value               Date                      WBC                      6.9                 11/30/2022                HGB                      11.8 (L)            11/30/2022                HCT                      37.1 (L)            11/30/2022                MCV                      80.1                11/30/2022                PLT                      389                 11/30/2022              Anesthesia Other Findings Suboxone last dose 4/28  Reproductive/Obstetrics                             Anesthesia Physical Anesthesia Plan  ASA: 3  Anesthesia Plan: General   Post-op Pain Management: Ketamine IV*, Ofirmev IV (intra-op)* and Toradol IV (intra-op)*   Induction: Intravenous  PONV Risk Score and Plan: 2 and Ondansetron and Dexamethasone  Airway Management Planned: Oral ETT and Video Laryngoscope  Planned  Additional Equipment: None  Intra-op Plan:   Post-operative Plan: Extubation in OR  Informed Consent: I have reviewed the patients History and Physical, chart, labs and discussed the procedure including the risks, benefits and alternatives for the proposed anesthesia with the patient or authorized representative who has indicated his/her understanding and acceptance.     Dental advisory given  Plan Discussed with: CRNA  Anesthesia Plan Comments:        Anesthesia Quick Evaluation

## 2022-12-13 ENCOUNTER — Encounter (HOSPITAL_COMMUNITY): Payer: Self-pay | Admitting: Neurosurgery

## 2022-12-13 MED FILL — Thrombin For Soln 5000 Unit: CUTANEOUS | Qty: 2 | Status: AC

## 2023-01-03 ENCOUNTER — Ambulatory Visit: Payer: Medicaid Other | Admitting: Family Medicine

## 2023-01-03 ENCOUNTER — Encounter: Payer: Self-pay | Admitting: Family Medicine

## 2023-01-03 VITALS — BP 137/78 | HR 100 | Temp 98.6°F | Ht 72.0 in | Wt 317.2 lb

## 2023-01-03 DIAGNOSIS — R600 Localized edema: Secondary | ICD-10-CM | POA: Diagnosis not present

## 2023-01-03 DIAGNOSIS — R7303 Prediabetes: Secondary | ICD-10-CM | POA: Diagnosis not present

## 2023-01-03 DIAGNOSIS — G4733 Obstructive sleep apnea (adult) (pediatric): Secondary | ICD-10-CM

## 2023-01-03 LAB — BAYER DCA HB A1C WAIVED: HB A1C (BAYER DCA - WAIVED): 5.9 % — ABNORMAL HIGH (ref 4.8–5.6)

## 2023-01-03 MED ORDER — HYDROCHLOROTHIAZIDE 25 MG PO TABS
25.0000 mg | ORAL_TABLET | Freq: Every morning | ORAL | 3 refills | Status: DC
Start: 2023-01-03 — End: 2023-12-22

## 2023-01-03 NOTE — Progress Notes (Signed)
Subjective: ZO:XWRUE PCP: Dettinger, Elige Radon, MD AVW:[id number] Schaver is a 37 y.o. male presenting to clinic today for:  1.  Edema Patient is accompanied today by his spouse.  He notes that he is struggled with bilateral lower extremity edema for quite some time now.  At times, he even has weeping of the legs.  He feels like swelling gets worse throughout the day and at times he cannot even differentiate his calf from his ankle due to the swelling of the legs.  His spouse notes that she has observed him pausing breathing at nighttime but he does not struggle with shortness of breath during the daytime.  They are actively working on sugar modification given his prediabetes identified on previous lab checkup.  However, he admits he does not salt restrict.  He typically eats [employer] every morning and is trying to wean himself from sodas.  He also reports history of sleep apnea that he does not treat.  This was diagnosed over 3 years ago.  He has had difficulty tolerating the CPAP machine but is willing to try it if this would help with swelling etc.  Additionally, he used to be on hydrochlorothiazide and is not sure why it was discontinued but would be willing to go back on it if that would aid in leg swelling.  Not utilizing any compression hose but would be glad to start wearing them.   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    Current Outpatient Medications:    Buprenorphine HCl-Naloxone HCl 8-2 MG FILM, Take 1 Film by mouth daily., Disp: , Rfl: 0   CALCIUM-MAGNESIUM-ZINC PO, Take 1 tablet by mouth daily., Disp: , Rfl:    lisinopril (ZESTRIL) 20 MG tablet, Take 1 tablet (20 mg total) by mouth daily., Disp: 90 tablet, Rfl: 3 Social History   Socioeconomic History   Marital status:  Significant Other    Spouse name: Not on file   Number of children: 0   Years of education: Not on file   Highest education level: Associate degree: occupational, Scientist, product/process development, or vocational program  Occupational History    Comment: employed  Tobacco Use   Smoking status: Former    Years: 15    Types: Cigarettes, E-cigarettes    Quit date: 05/03/2012    Years since quitting: 10.6   Smokeless tobacco: Never   Tobacco comments:    05/31/21 E [employer] vape   Vaping Use   Vaping Use: Every day  Substance and Sexual Activity   Alcohol use: No   Drug use: No    Comment: previous heroine addiction and narcotic addiction; Suboxone since 2013.   Sexual activity: Yes  Other Topics Concern   Not on file  Social History Narrative   Drugs:  Hx of Heroine abuse; suboxone since 2013.  Exercise: none   Social Determinants of Health   Financial Resource Strain: Not on file  Food Insecurity: Not on file  Transportation Needs: Not on file  Physical Activity: Not on file  Stress: Not on file  Social Connections: Not on file  Intimate Partner Violence: Not on file   Family History  Problem Relation Age of Onset   Diabetes Mother    Hypertension Mother    Diabetes Father    Hypertension Father  Objective: Office vital signs reviewed. BP 137/78   Pulse 100   Temp 98.6 F (37 C)   Ht 6' (1.829 m)   Wt (!) 317 lb 3.2 oz (143.9 kg)   SpO2 93%   BMI 43.02 kg/m   Physical Examination:  General: Awake, alert, morbidly obese, No acute distress HEENT: sclera white, MMM. No JVD Cardio: regular rate and rhythm, S1S2 heard, no murmurs appreciated Pulm: clear to auscultation bilaterally, no wheezes, rhonchi or rales; normal work of breathing on room air Extremities: warm, well perfused, +2 pitting edema to knees bilaterally, no cyanosis or clubbing; bilateral lower extremity with stasis dermatitis present but no evidence of secondary bacterial infection MSK: Antalgic gait   Assessment/  Plan: 37 y.o. male   Bilateral leg edema - Plan: CMP14+EGFR, T4, Free, Brain natriuretic peptide, CBC, Compression stockings, hydrochlorothiazide (HYDRODIURIL) 25 MG tablet  Morbid obesity (HCC) - Plan: VITAMIN D 25 Hydroxy (Vit-D Deficiency, Fractures)  OSA (obstructive sleep apnea)  Pre-diabetes - Plan: Bayer DCA Hb A1c Waived  Suspect secondary to venous stasis edema given morbid obesity, untreated sleep apnea and excessive salt intake.  We discussed the importance of treating the sleep apnea, particularly given cardiovascular applications should this continue to be untreated.  We will recheck his A1c.  I reinforced need for salt restriction.  Compression hose ordered and sent to pharmacy.  Reinitiate hydrochlorothiazide.  Patient blood pressure is borderline high today so I think that he will tolerate this without difficulty.  Check BNP, free T4, electrolytes, liver enzymes, albumin level and recheck CBC given slight anemia noted at discharge from hospital.  I have asked that he follow-up with PCP in 4 weeks for recheck and graduation of compression hose if needed at that time.  Would strongly consider it if his insurance will pay for it, adding a GLP to aid in weight loss goals and reduction of blood sugar.  Will defer this to PCP  No orders of the defined types were placed in this encounter.  No orders of the defined types were placed in this encounter.    Raliegh Ip, DO [facility] [phone]

## 2023-01-03 NOTE — Patient Instructions (Signed)
Edema  Edema is when you have too much fluid in your body or under your skin. Edema may make your legs, feet, and ankles swell. Swelling often happens in looser tissues, such as around your eyes. This is a common condition. It gets more common as you get older. There are many possible causes of edema. These include: Eating too much salt (sodium). Being on your feet or sitting for a long time. Certain medical conditions, such as: Pregnancy. Heart failure. Liver disease. Kidney disease. Cancer. Hot weather may make edema worse. Edema is usually painless. Your skin may look swollen or shiny. Follow these instructions at home: Medicines Take over-the-counter and prescription medicines only as told by your doctor. Your doctor may prescribe a medicine to help your body get rid of extra water (diuretic). Take this medicine if you are told to take it. Eating and drinking Eat a low-salt (low-sodium) diet as told by your doctor. Sometimes, eating less salt may reduce swelling. Depending on the cause of your swelling, you may need to limit how much fluid you drink (fluid restriction). General instructions Raise the injured area above the level of your heart while you are sitting or lying down. Do not sit still or stand for a long time. Do not wear tight clothes. Do not wear garters on your upper legs. Exercise your legs. This can help the swelling go down. Wear compression stockings as told by your doctor. It is important that these are the right size. These should be prescribed by your doctor to prevent possible injuries. If elastic bandages or wraps are recommended, use them as told by your doctor. Contact a doctor if: Treatment is not working. You have heart, liver, or kidney disease and have symptoms of edema. You have sudden and unexplained weight gain. Get help right away if: You have shortness of breath or chest pain. You cannot breathe when you lie down. You have pain, redness, or  warmth in the swollen areas. You have heart, liver, or kidney disease and get edema all of a sudden. You have a fever and your symptoms get worse all of a sudden. These symptoms may be an emergency. Get help right away. Call 911. Do not wait to see if the symptoms will go away. Do not drive yourself to the hospital. Summary Edema is when you have too much fluid in your body or under your skin. Edema may make your legs, feet, and ankles swell. Swelling often happens in looser tissues, such as around your eyes. Raise the injured area above the level of your heart while you are sitting or lying down. Follow your doctor's instructions about diet and how much fluid you can drink. This information is not intended to replace advice given to you by your health care provider. Make sure you discuss any questions you have with your health care provider. Document Revised: 04/05/2021 Document Reviewed: 04/05/2021 Elsevier Patient Education  2023 Elsevier Inc.  Sleep Apnea Sleep apnea is a condition in which breathing pauses or becomes shallow during sleep. People with sleep apnea usually snore loudly. They may have times when they gasp and stop breathing for 10 seconds or more during sleep. This may happen many times during the night. Sleep apnea disrupts your sleep and keeps your body from getting the rest that it needs. This condition can increase your risk of certain health problems, including: Heart attack. Stroke. Obesity. Type 2 diabetes. Heart failure. Irregular heartbeat. High blood pressure. The goal of treatment is to help  you breathe normally again. What are the causes?  The most common cause of sleep apnea is a collapsed or blocked airway. There are three kinds of sleep apnea: Obstructive sleep apnea. This kind is caused by a blocked or collapsed airway. Central sleep apnea. This kind happens when the part of the brain that controls breathing does not send the correct signals to the  muscles that control breathing. Mixed sleep apnea. This is a combination of obstructive and central sleep apnea. What increases the risk? You are more likely to develop this condition if you: Are overweight. Smoke. Have a smaller than normal airway. Are older. Are male. Drink alcohol. Take sedatives or tranquilizers. Have a family history of sleep apnea. Have a tongue or tonsils that are larger than normal. What are the signs or symptoms? Symptoms of this condition include: Trouble staying asleep. Loud snoring. Morning headaches. Waking up gasping. Dry mouth or sore throat in the morning. Daytime sleepiness and tiredness. If you have daytime fatigue because of sleep apnea, you may be more likely to have: Trouble concentrating. Forgetfulness. Irritability or mood swings. Personality changes. Feelings of depression. Sexual dysfunction. This may include loss of interest if you are male, or erectile dysfunction if you are male. How is this diagnosed? This condition may be diagnosed with: A medical history. A physical exam. A series of tests that are done while you are sleeping (sleep study). These tests are usually done in a sleep lab, but they may also be done at home. How is this treated? Treatment for this condition aims to restore normal breathing and to ease symptoms during sleep. It may involve managing health issues that can affect breathing, such as high blood pressure or obesity. Treatment may include: Sleeping on your side. Using a decongestant if you have nasal congestion. Avoiding the use of depressants, including alcohol, sedatives, and narcotics. Losing weight if you are overweight. Making changes to your diet. Quitting smoking. Using a device to open your airway while you sleep, such as: An oral appliance. This is a custom-made mouthpiece that shifts your lower jaw forward. A continuous positive airway pressure (CPAP) device. This device blows air through a  mask when you breathe out (exhale). A nasal expiratory positive airway pressure (EPAP) device. This device has valves that you put into each nostril. A bi-level positive airway pressure (BIPAP) device. This device blows air through a mask when you breathe in (inhale) and breathe out (exhale). Having surgery if other treatments do not work. During surgery, excess tissue is removed to create a wider airway. Follow these instructions at home: Lifestyle Make any lifestyle changes that your health care provider recommends. Eat a healthy, well-balanced diet. Take steps to lose weight if you are overweight. Avoid using depressants, including alcohol, sedatives, and narcotics. Do not use any products that contain nicotine or tobacco. These products include cigarettes, chewing tobacco, and vaping devices, such as e-cigarettes. If you need help quitting, ask your health care provider. General instructions Take over-the-counter and prescription medicines only as told by your health care provider. If you were given a device to open your airway while you sleep, use it only as told by your health care provider. If you are having surgery, make sure to tell your health care provider you have sleep apnea. You may need to bring your device with you. Keep all follow-up visits. This is important. Contact a health care provider if: The device that you received to open your airway during sleep is uncomfortable or  does not seem to be working. Your symptoms do not improve. Your symptoms get worse. Get help right away if: You develop: Chest pain. Shortness of breath. Discomfort in your back, arms, or stomach. You have: Trouble speaking. Weakness on one side of your body. Drooping in your face. These symptoms may represent a serious problem that is an emergency. Do not wait to see if the symptoms will go away. Get medical help right away. Call your local emergency services (911 in the [place] Do not drive yourself  to the hospital. Summary Sleep apnea is a condition in which breathing pauses or becomes shallow during sleep. The most common cause is a collapsed or blocked airway. The goal of treatment is to restore normal breathing and to ease symptoms during sleep. This information is not intended to replace advice given to you by your health care provider. Make sure you discuss any questions you have with your health care provider. Document Revised: 03/10/2021 Document Reviewed: 07/10/2020 Elsevier Patient Education  2023 [employer].

## 2023-01-04 LAB — CBC
Hematocrit: 37.2 % — ABNORMAL LOW (ref 37.5–51.0)
Hemoglobin: 11.6 g/dL — ABNORMAL LOW (ref 13.0–17.7)
MCH: 24.5 pg — ABNORMAL LOW (ref 26.6–33.0)
MCHC: 31.2 g/dL — ABNORMAL LOW (ref 31.5–35.7)
MCV: 79 fL (ref 79–97)
Platelets: 375 10*3/uL (ref 150–450)
RBC: 4.73 x10E6/uL (ref 4.14–5.80)
RDW: 14.8 % (ref 11.6–15.4)
WBC: 6.5 10*3/uL (ref 3.4–10.8)

## 2023-01-04 LAB — CMP14+EGFR
ALT: 14 IU/L (ref 0–44)
AST: 18 IU/L (ref 0–40)
Albumin/Globulin Ratio: 1.3 (ref 1.2–2.2)
Albumin: 3.9 g/dL — ABNORMAL LOW (ref 4.1–5.1)
Alkaline Phosphatase: 58 IU/L (ref 44–121)
BUN/Creatinine Ratio: 20 (ref 9–20)
BUN: 14 mg/dL (ref 6–20)
Bilirubin Total: 0.2 mg/dL (ref 0.0–1.2)
CO2: 26 mmol/L (ref 20–29)
Calcium: 8.9 mg/dL (ref 8.7–10.2)
Chloride: 98 mmol/L (ref 96–106)
Creatinine, Ser: 0.7 mg/dL — ABNORMAL LOW (ref 0.76–1.27)
Globulin, Total: 3.1 g/dL (ref 1.5–4.5)
Glucose: 144 mg/dL — ABNORMAL HIGH (ref 70–99)
Potassium: 4.4 mmol/L (ref 3.5–5.2)
Sodium: 136 mmol/L (ref 134–144)
Total Protein: 7 g/dL (ref 6.0–8.5)
eGFR: 122 mL/min/{1.73_m2} (ref >59)

## 2023-01-04 LAB — T4, FREE: Free T4: 1.08 ng/dL (ref 0.82–1.77)

## 2023-01-04 LAB — VITAMIN D 25 HYDROXY (VIT D DEFICIENCY, FRACTURES): Vit D, 25-Hydroxy: 31.4 ng/mL (ref 30.0–100.0)

## 2023-01-04 LAB — BRAIN NATRIURETIC PEPTIDE: BNP: 8.7 pg/mL (ref 0.0–100.0)

## 2023-02-09 ENCOUNTER — Encounter: Payer: Self-pay | Admitting: Family Medicine

## 2023-02-09 ENCOUNTER — Ambulatory Visit: Payer: Medicaid Other | Admitting: Family Medicine

## 2023-02-09 VITALS — BP 137/78 | HR 103 | Ht 72.0 in | Wt 311.0 lb

## 2023-02-09 DIAGNOSIS — I878 Other specified disorders of veins: Secondary | ICD-10-CM

## 2023-02-09 DIAGNOSIS — I10 Essential (primary) hypertension: Secondary | ICD-10-CM | POA: Diagnosis not present

## 2023-02-09 DIAGNOSIS — G4733 Obstructive sleep apnea (adult) (pediatric): Secondary | ICD-10-CM | POA: Diagnosis not present

## 2023-02-09 MED ORDER — SEMAGLUTIDE-WEIGHT MANAGEMENT 1.7 MG/0.75ML ~~LOC~~ SOAJ
1.7000 mg | SUBCUTANEOUS | 0 refills | Status: DC
Start: 2023-05-07 — End: 2023-03-24

## 2023-02-09 MED ORDER — SEMAGLUTIDE-WEIGHT MANAGEMENT 0.25 MG/0.5ML ~~LOC~~ SOAJ
0.2500 mg | SUBCUTANEOUS | 0 refills | Status: AC
Start: 2023-02-09 — End: 2023-03-09

## 2023-02-09 MED ORDER — SEMAGLUTIDE-WEIGHT MANAGEMENT 0.5 MG/0.5ML ~~LOC~~ SOAJ
0.5000 mg | SUBCUTANEOUS | 0 refills | Status: DC
Start: 2023-03-10 — End: 2023-03-24

## 2023-02-09 MED ORDER — SEMAGLUTIDE-WEIGHT MANAGEMENT 2.4 MG/0.75ML ~~LOC~~ SOAJ
2.4000 mg | SUBCUTANEOUS | 0 refills | Status: DC
Start: 2023-06-05 — End: 2023-03-24

## 2023-02-09 MED ORDER — SEMAGLUTIDE-WEIGHT MANAGEMENT 1 MG/0.5ML ~~LOC~~ SOAJ
1.0000 mg | SUBCUTANEOUS | 0 refills | Status: DC
Start: 2023-04-08 — End: 2023-03-24

## 2023-02-09 NOTE — Progress Notes (Signed)
BP 137/78   Pulse (!) 103   Ht 6' (1.829 m)   Wt (!) 311 lb (141.1 kg)   SpO2 98%   BMI 42.18 kg/m    Subjective:   Patient ID: Darrell Henry, male    DOB: Apr 29, 1986, 37 y.o.   MRN: [id number]  HPI: Darrell Henry is a 37 y.o. male presenting on 02/09/2023 for Medical Management of Chronic Issues and Hypertension   HPI Hypertension Patient is currently on lisinopril and hydrochlorothiazide, and their blood pressure today is 137/78. Patient denies any lightheadedness or dizziness. Patient denies headaches, blurred vision, chest pains, shortness of breath, or weakness. Denies any side effects from medication and is content with current medication.   Peripheral edema Patient is coming in for recheck of peripheral edema.  She is wearing compression stockings some of the time and has been taking the hydrochlorothiazide and feels like it is a little bit better.  He does not wear the compression stockings all of the time he admits.  He does have sleep apnea and has not used his machine because of issues with the mask and the pressure.  Relevant past medical, surgical, family and social history reviewed and updated as indicated. Interim medical history since our last visit reviewed. Allergies and medications reviewed and updated.  Review of Systems  Constitutional:  Negative for chills and fever.  Eyes:  Negative for visual disturbance.  Respiratory:  Negative for shortness of breath and wheezing.   Cardiovascular:  Positive for leg swelling. Negative for chest pain and palpitations.  Musculoskeletal:  Negative for back pain and gait problem.  Skin:  Negative for rash.  Neurological:  Negative for dizziness, weakness and light-headedness.  All other systems reviewed and are negative.   Per HPI unless specifically indicated above   Allergies as of 02/09/2023   No Known Allergies      Medication List        Accurate as of February 09, 2023  1:50 PM. If you have any questions, ask  your nurse or doctor.          Buprenorphine HCl-Naloxone HCl 8-2 MG Film Take 1 Film by mouth daily.   CALCIUM-MAGNESIUM-ZINC PO Take 1 tablet by mouth daily.   hydrochlorothiazide 25 MG tablet Commonly known as: HYDRODIURIL Take 1 tablet (25 mg total) by mouth in the morning. As needed for leg swelling   lisinopril 20 MG tablet Commonly known as: ZESTRIL Take 1 tablet (20 mg total) by mouth daily.   Semaglutide-Weight Management 0.25 MG/0.5ML Soaj Inject 0.25 mg into the skin once a week for 28 days. Started by: Nils Pyle, MD   Semaglutide-Weight Management 0.5 MG/0.5ML Soaj Inject 0.5 mg into the skin once a week for 28 days. Start taking on: March 10, 2023 Started by: Elige Radon Rozalyn Osland, MD   Semaglutide-Weight Management 1 MG/0.5ML Soaj Inject 1 mg into the skin once a week for 28 days. Start taking on: April 08, 2023 Started by: Elige Radon Cabell Lazenby, MD   Semaglutide-Weight Management 1.7 MG/0.75ML Soaj Inject 1.7 mg into the skin once a week for 28 days. Start taking on: May 07, 2023 Started by: Elige Radon Mackenze Grandison, MD   Semaglutide-Weight Management 2.4 MG/0.75ML Soaj Inject 2.4 mg into the skin once a week for 28 days. Start taking on: June 05, 2023 Started by: Elige Radon Json Koelzer, MD         Objective:   BP 137/78   Pulse (!) 103   Ht 6' (1.829 m)  Wt (!) 311 lb (141.1 kg)   SpO2 98%   BMI 42.18 kg/m   Wt Readings from Last 3 Encounters:  02/09/23 (!) 311 lb (141.1 kg)  01/03/23 (!) 317 lb 3.2 oz (143.9 kg)  12/12/22 (!) 312 lb (141.5 kg)    Physical Exam Vitals and nursing note reviewed.  Constitutional:      General: He is not in acute distress.    Appearance: He is well-developed. He is not diaphoretic.  Eyes:     General: No scleral icterus.    Conjunctiva/sclera: Conjunctivae normal.  Neck:     Thyroid: No thyromegaly.  Cardiovascular:     Rate and Rhythm: Normal rate and regular rhythm.     Heart sounds: Normal  heart sounds. No murmur heard. Pulmonary:     Effort: Pulmonary effort is normal. No respiratory distress.     Breath sounds: Normal breath sounds. No wheezing.  Musculoskeletal:        General: Swelling (2+ peripheral edema, wearing compression stocking) present. Normal range of motion.     Cervical back: Neck supple.  Lymphadenopathy:     Cervical: No cervical adenopathy.  Skin:    General: Skin is warm and dry.     Findings: No lesion or rash.  Neurological:     Mental Status: He is alert and oriented to person, place, and time.     Coordination: Coordination normal.  Psychiatric:        Behavior: Behavior normal.       Assessment & Plan:   Problem List Items Addressed This Visit       Cardiovascular and Mediastinum   HTN (hypertension) - Primary   Relevant Orders   CMP14+EGFR     Respiratory   OSA (obstructive sleep apnea)   Relevant Orders   Ambulatory referral to Neurology     Other   Morbid obesity (HCC)   Relevant Medications   Semaglutide-Weight Management 0.25 MG/0.5ML SOAJ   Semaglutide-Weight Management 0.5 MG/0.5ML SOAJ (Start on 03/10/2023)   Semaglutide-Weight Management 1 MG/0.5ML SOAJ (Start on 04/08/2023)   Semaglutide-Weight Management 1.7 MG/0.75ML SOAJ (Start on 05/07/2023)   Semaglutide-Weight Management 2.4 MG/0.75ML SOAJ (Start on 06/05/2023)   Venous stasis   Relevant Orders   US ABDOMEN LIMITED RUQ (LIVER/GB)    Will try for [facility] for weight loss, will do u/s liver because of edema.  Follow up plan: Return if symptoms worsen or fail to improve, for Hypertension and peripheral edema 1-2 months.  Counseling provided for all of the vaccine components Orders Placed This Encounter  Procedures   US ABDOMEN LIMITED RUQ (LIVER/GB)   CMP14+EGFR   Ambulatory referral to Neurology    Arville Care, MD [facility] 02/09/2023, 1:50 PM

## 2023-02-10 LAB — CMP14+EGFR
ALT: 16 IU/L (ref 0–44)
AST: 23 IU/L (ref 0–40)
Albumin: 3.9 g/dL — ABNORMAL LOW (ref 4.1–5.1)
Alkaline Phosphatase: 55 IU/L (ref 44–121)
BUN/Creatinine Ratio: 20 (ref 9–20)
BUN: 14 mg/dL (ref 6–20)
Bilirubin Total: 0.2 mg/dL (ref 0.0–1.2)
CO2: 27 mmol/L (ref 20–29)
Calcium: 9.1 mg/dL (ref 8.7–10.2)
Chloride: 97 mmol/L (ref 96–106)
Creatinine, Ser: 0.7 mg/dL — ABNORMAL LOW (ref 0.76–1.27)
Globulin, Total: 3.1 g/dL (ref 1.5–4.5)
Glucose: 104 mg/dL — ABNORMAL HIGH (ref 70–99)
Potassium: 4.3 mmol/L (ref 3.5–5.2)
Sodium: 137 mmol/L (ref 134–144)
Total Protein: 7 g/dL (ref 6.0–8.5)
eGFR: 122 mL/min/{1.73_m2} (ref >59)

## 2023-02-16 ENCOUNTER — Other Ambulatory Visit (HOSPITAL_COMMUNITY): Payer: Self-pay

## 2023-02-16 ENCOUNTER — Telehealth: Payer: Self-pay

## 2023-02-16 NOTE — Telephone Encounter (Signed)
Pharmacy Patient Advocate Encounter   Received notification from CoverMyMeds that prior authorization for Wegovy 0.25mg /0.67ml is required/requested.   Insurance verification completed.   The patient is insured through [employer] .   Per test claim:  Medication is not covered for weight loss under medicaid, no PA submitted at this time.

## 2023-02-17 NOTE — Telephone Encounter (Signed)
Please let patient know that Reginal Lutes is not covered by his insurance plan

## 2023-02-17 NOTE — Telephone Encounter (Signed)
Left message making pt aware of instructions.

## 2023-02-17 NOTE — Telephone Encounter (Signed)
Pt made aware.  Pt reviewed Side effects of Lisinopril and it could cause swelling in lower extremities. Pt said he has d/c the medication and his legs are much better.\\  He has not taken his BP to see what it has been running. Pt stopped the medication 3-4d ago.

## 2023-02-17 NOTE — Telephone Encounter (Signed)
He needs to check his blood pressure every day and let me know how it is running in the next 2 weeks.  Do not check it at the same time every day, mix it up

## 2023-02-23 ENCOUNTER — Ambulatory Visit (HOSPITAL_COMMUNITY)
Admission: RE | Admit: 2023-02-23 | Discharge: 2023-02-23 | Disposition: A | Discharge status: Home / Self Care | Payer: Medicaid Other | Source: Ambulatory Visit | Attending: Family Medicine | Admitting: Family Medicine

## 2023-02-23 DIAGNOSIS — I878 Other specified disorders of veins: Secondary | ICD-10-CM | POA: Diagnosis present

## 2023-03-24 ENCOUNTER — Ambulatory Visit: Payer: Medicaid Other | Admitting: Family Medicine

## 2023-03-24 ENCOUNTER — Encounter: Payer: Self-pay | Admitting: Family Medicine

## 2023-03-24 VITALS — BP 128/86 | HR 110 | Ht 72.0 in | Wt 318.0 lb

## 2023-03-24 DIAGNOSIS — I878 Other specified disorders of veins: Secondary | ICD-10-CM

## 2023-03-24 DIAGNOSIS — E785 Hyperlipidemia, unspecified: Secondary | ICD-10-CM | POA: Diagnosis not present

## 2023-03-24 DIAGNOSIS — I10 Essential (primary) hypertension: Secondary | ICD-10-CM

## 2023-03-24 MED ORDER — SEMAGLUTIDE-WEIGHT MANAGEMENT 0.5 MG/0.5ML ~~LOC~~ SOAJ
0.5000 mg | SUBCUTANEOUS | 0 refills | Status: AC
Start: 2023-04-22 — End: 2023-05-20

## 2023-03-24 MED ORDER — FUROSEMIDE 20 MG PO TABS
20.0000 mg | ORAL_TABLET | Freq: Every day | ORAL | 3 refills | Status: DC
Start: 2023-03-24 — End: 2023-06-06

## 2023-03-24 MED ORDER — SEMAGLUTIDE-WEIGHT MANAGEMENT 0.25 MG/0.5ML ~~LOC~~ SOAJ
0.2500 mg | SUBCUTANEOUS | 0 refills | Status: AC
Start: 2023-03-24 — End: 2023-04-21

## 2023-03-24 MED ORDER — SEMAGLUTIDE-WEIGHT MANAGEMENT 1.7 MG/0.75ML ~~LOC~~ SOAJ
1.7000 mg | SUBCUTANEOUS | 0 refills | Status: AC
Start: 2023-06-19 — End: 2023-07-17

## 2023-03-24 MED ORDER — SEMAGLUTIDE-WEIGHT MANAGEMENT 1 MG/0.5ML ~~LOC~~ SOAJ
1.0000 mg | SUBCUTANEOUS | 0 refills | Status: AC
Start: 2023-05-21 — End: 2023-06-18

## 2023-03-24 MED ORDER — SEMAGLUTIDE-WEIGHT MANAGEMENT 2.4 MG/0.75ML ~~LOC~~ SOAJ
2.4000 mg | SUBCUTANEOUS | 0 refills | Status: AC
Start: 2023-07-18 — End: 2023-08-15

## 2023-03-24 NOTE — Progress Notes (Signed)
BP 128/86   Pulse (!) 110   Ht 6' (1.829 m)   Wt (!) 318 lb (144.2 kg)   SpO2 96%   BMI 43.13 kg/m    Subjective:   Patient ID: Darrell Henry, male    DOB: Dec 26, 1985, 37 y.o.   MRN: [id number]  HPI: Darrell Henry is a 37 y.o. male presenting on 03/24/2023 for Medical Management of Chronic Issues, Hypertension, and peripheral edema   HPI Hypertension Patient is currently on lisinopril and hydrochlorothiazide, and their blood pressure today is 128/86. Patient denies any lightheadedness or dizziness. Patient denies headaches, blurred vision, chest pains, shortness of breath, or weakness. Denies any side effects from medication and is content with current medication.   Hyperlipidemia Patient is coming in for recheck of his hyperlipidemia. The patient is currently taking no medicine currently for this, trying diet control. They deny any issues with myalgias or history of liver damage from it. They deny any focal numbness or weakness or chest pain.   Venous stasis and peripheral edema recheck Still having a lot of issues with swelling.  The hydrochlorothiazide addition did not seem to help.  He does have uncontrolled sleep apnea and also alcohol intake on certain days.  The morbid obesity attributes to his venous stasis.  His liver and kidney numbers have been fine and shows no signs of symptoms of congestive heart failure.  Relevant past medical, surgical, family and social history reviewed and updated as indicated. Interim medical history since our last visit reviewed. Allergies and medications reviewed and updated.  Review of Systems  Constitutional:  Negative for chills and fever.  Eyes:  Negative for visual disturbance.  Respiratory:  Negative for shortness of breath and wheezing.   Cardiovascular:  Positive for leg swelling. Negative for chest pain and palpitations.  Musculoskeletal:  Negative for back pain and gait problem.  Skin:  Negative for rash.  Neurological:  Negative  for dizziness, weakness and light-headedness.  All other systems reviewed and are negative.   Per HPI unless specifically indicated above   Allergies as of 03/24/2023   No Known Allergies      Medication List        Accurate as of March 24, 2023  2:52 PM. If you have any questions, ask your nurse or doctor.          STOP taking these medications    Semaglutide-Weight Management 0.5 MG/0.5ML Soaj Replaced by: Semaglutide-Weight Management 0.25 MG/0.5ML Soaj Stopped by: Elige Radon Sianna Garofano   Semaglutide-Weight Management 1 MG/0.5ML Soaj Replaced by: Semaglutide-Weight Management 0.5 MG/0.5ML Soaj Stopped by: Elige Radon Devaun Hernandez   Semaglutide-Weight Management 1.7 MG/0.75ML Soaj Replaced by: Semaglutide-Weight Management 1 MG/0.5ML Soaj Stopped by: Elige Radon Agustina Witzke   Semaglutide-Weight Management 2.4 MG/0.75ML Soaj Replaced by: Semaglutide-Weight Management 1.7 MG/0.75ML Soaj Stopped by: Elige Radon Serenity Batley       TAKE these medications    Buprenorphine HCl-Naloxone HCl 8-2 MG Film Take 1 Film by mouth daily.   CALCIUM-MAGNESIUM-ZINC PO Take 1 tablet by mouth daily.   furosemide 20 MG tablet Commonly known as: LASIX Take 1 tablet (20 mg total) by mouth daily. Started by: Elige Radon Harriette Tovey   hydrochlorothiazide 25 MG tablet Commonly known as: HYDRODIURIL Take 1 tablet (25 mg total) by mouth in the morning. As needed for leg swelling   lisinopril 20 MG tablet Commonly known as: ZESTRIL Take 1 tablet (20 mg total) by mouth daily.   Semaglutide-Weight Management 0.25 MG/0.5ML Soaj Inject 0.25 mg into the  skin once a week for 28 days. Replaces: Semaglutide-Weight Management 0.5 MG/0.5ML Soaj Started by: Elige Radon Emil Klassen   Semaglutide-Weight Management 0.5 MG/0.5ML Soaj Inject 0.5 mg into the skin once a week for 28 days. Start taking on: April 22, 2023 Replaces: Semaglutide-Weight Management 1 MG/0.5ML Soaj Started by: Elige Radon Darshan Solanki    Semaglutide-Weight Management 1 MG/0.5ML Soaj Inject 1 mg into the skin once a week for 28 days. Start taking on: May 21, 2023 Replaces: Semaglutide-Weight Management 1.7 MG/0.75ML Soaj Started by: Elige Radon Lawrie Tunks   Semaglutide-Weight Management 1.7 MG/0.75ML Soaj Inject 1.7 mg into the skin once a week for 28 days. Start taking on: June 19, 2023 Replaces: Semaglutide-Weight Management 2.4 MG/0.75ML Soaj Started by: Elige Radon Jannifer Fischler   Semaglutide-Weight Management 2.4 MG/0.75ML Soaj Inject 2.4 mg into the skin once a week for 28 days. Start taking on: July 18, 2023 Started by: Ivin Booty A Braxon Suder         Objective:   BP 128/86   Pulse (!) 110   Ht 6' (1.829 m)   Wt (!) 318 lb (144.2 kg)   SpO2 96%   BMI 43.13 kg/m   Wt Readings from Last 3 Encounters:  03/24/23 (!) 318 lb (144.2 kg)  02/09/23 (!) 311 lb (141.1 kg)  01/03/23 (!) 317 lb 3.2 oz (143.9 kg)    Physical Exam Vitals and nursing note reviewed.  Constitutional:      General: He is not in acute distress.    Appearance: He is well-developed. He is not diaphoretic.  Eyes:     General: No scleral icterus.    Conjunctiva/sclera: Conjunctivae normal.     Pupils: Pupils are equal, round, and reactive to light.  Neck:     Thyroid: No thyromegaly.  Cardiovascular:     Rate and Rhythm: Normal rate and regular rhythm.     Heart sounds: Normal heart sounds. No murmur heard. Pulmonary:     Effort: Pulmonary effort is normal. No respiratory distress.     Breath sounds: Normal breath sounds. No wheezing.  Musculoskeletal:        General: Swelling (1+ edema bilateral lower extremity) present. Normal range of motion.     Cervical back: Neck supple.  Lymphadenopathy:     Cervical: No cervical adenopathy.  Skin:    General: Skin is warm and dry.     Findings: No erythema or rash.  Neurological:     Mental Status: He is alert and oriented to person, place, and time.     Coordination: Coordination  normal.  Psychiatric:        Behavior: Behavior normal.       Assessment & Plan:   Problem List Items Addressed This Visit       Cardiovascular and Mediastinum   HTN (hypertension) - Primary   Relevant Medications   furosemide (LASIX) 20 MG tablet     Other   Dyslipidemia   Morbid obesity (HCC)   Relevant Medications   Semaglutide-Weight Management 0.25 MG/0.5ML SOAJ   Semaglutide-Weight Management 0.5 MG/0.5ML SOAJ (Start on 04/22/2023)   Semaglutide-Weight Management 1 MG/0.5ML SOAJ (Start on 05/21/2023)   Semaglutide-Weight Management 1.7 MG/0.75ML SOAJ (Start on 06/19/2023)   Semaglutide-Weight Management 2.4 MG/0.75ML SOAJ (Start on 07/18/2023)   Venous stasis   Relevant Medications   furosemide (LASIX) 20 MG tablet    He has tried compression stockings and did not do well on that.  Hydrochlorothiazide did not seem to help his legs as much.  His blood  pressure is better so we will keep that medicine but will add furosemide to see if it helps more with the legs.  Back in 1 to 2 months for recheck.  Patient's BMI is >30 mg/m2.  Patient's current BMI is Body mass index is 43.13 kg/m.Marland Kitchen  Patient is currently encouraged to participate in a healthy eating plan along with encouraged exercise. Patient does not have a personal or family history of medullary thyroid carcinoma (MTC) or Multiple Endocrine Neoplasia syndrome type 2 (MEN 2).  Follow up plan: Return if symptoms worsen or fail to improve, for 1 to 2 months hypertension edema recheck.  Counseling provided for all of the vaccine components No orders of the defined types were placed in this encounter.   Arville Care, MD [facility] 03/24/2023, 2:52 PM

## 2023-03-29 ENCOUNTER — Other Ambulatory Visit (HOSPITAL_COMMUNITY): Payer: Self-pay

## 2023-03-29 NOTE — Telephone Encounter (Signed)
Pharmacy Patient Advocate Encounter   Received notification from CoverMyMeds that prior authorization for [facility] is required/requested.   Insurance verification completed.   The patient is insured through [facility] .   Per test claim: PA required; PA submitted to [facility] via CoverMyMeds Key/confirmation #/EOC [id number] Status is pending

## 2023-03-30 ENCOUNTER — Other Ambulatory Visit (HOSPITAL_COMMUNITY): Payer: Self-pay

## 2023-03-30 ENCOUNTER — Encounter: Payer: Self-pay | Admitting: Family Medicine

## 2023-03-30 NOTE — Telephone Encounter (Signed)
Pharmacy Patient Advocate Encounter  Received notification from [facility] that Prior Authorization for Darrell Henry has been APPROVED from 03/29/23 to 09/25/23. Ran test claim, Copay is $4. This test claim was processed through [facility]- copay amounts may vary at other pharmacies due to pharmacy/plan contracts, or as the patient moves through the different stages of their insurance plan.   PA #/Case ID/Reference #: [id number]

## 2023-05-15 ENCOUNTER — Other Ambulatory Visit (HOSPITAL_COMMUNITY): Payer: Self-pay

## 2023-05-15 MED ORDER — SEMAGLUTIDE-WEIGHT MANAGEMENT 0.25 MG/0.5ML ~~LOC~~ SOAJ
0.2500 mg | SUBCUTANEOUS | 0 refills | Status: DC
  Filled 2023-05-15: qty 2, 28d supply, fill #0

## 2023-05-18 ENCOUNTER — Ambulatory Visit: Payer: Medicaid Other | Admitting: Family Medicine

## 2023-06-06 ENCOUNTER — Other Ambulatory Visit: Payer: Self-pay

## 2023-06-06 ENCOUNTER — Ambulatory Visit
Admission: EM | Admit: 2023-06-06 | Discharge: 2023-06-06 | Disposition: A | Discharge status: Home / Self Care | Payer: Medicaid Other | Attending: Family Medicine | Admitting: Family Medicine

## 2023-06-06 DIAGNOSIS — L03039 Cellulitis of unspecified toe: Secondary | ICD-10-CM

## 2023-06-06 MED ORDER — CEFADROXIL 500 MG PO CAPS: 500.0000 mg | ORAL_CAPSULE | Freq: Two times a day (BID) | ORAL | 0 refills | Status: DC

## 2023-06-06 NOTE — ED Provider Notes (Signed)
[facility]    CSN: [id number] Arrival date & time: 06/06/23  1524      History   Chief Complaint Chief Complaint  Patient presents with   Toe Pain    HPI Darrell Henry is a 37 y.o. male.   HPI  Patient is here for an ingrown toenail.  He has had toe pain for 4 to 5 days.  He has some purulence draining from the edge of his nail today.  He states he has not had this problem since he was a child.  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 Authorizing Provider  cefadroxil (DURICEF) 500 MG capsule Take 1 capsule (500 mg total) by mouth 2 (two) times daily. 06/06/23  Yes Eustace Moore, MD  Buprenorphine HCl-Naloxone HCl 8-2 MG FILM Take 1 Film by mouth daily. 03/16/16   [provider]  hydrochlorothiazide (HYDRODIURIL) 25 MG tablet Take 1 tablet (25 mg total) by mouth in the morning. As needed for leg swelling 01/03/23   Delynn Flavin M, DO  lisinopril (ZESTRIL) 20 MG tablet Take 1 tablet (20 mg total) by mouth daily. 09/29/22   Dettinger, Elige Radon, MD  Semaglutide-Weight Management 0.25 MG/0.5ML SOAJ Inject 0.25 mg into the skin once a week. 03/24/23    Dettinger, Elige Radon, MD  Semaglutide-Weight Management 1 MG/0.5ML SOAJ Inject 1 mg into the skin once a week for 28 days. 05/21/23 06/18/23  Dettinger, Elige Radon, MD  Semaglutide-Weight Management 1.7 MG/0.75ML SOAJ Inject 1.7 mg into the skin once a week for 28 days. 06/19/23 07/17/23  Dettinger, Elige Radon, MD  Semaglutide-Weight Management 2.4 MG/0.75ML SOAJ Inject 2.4 mg into the skin once a week for 28 days. 07/18/23 08/15/23  Dettinger, Elige Radon, MD    Family History Family History  Problem Relation Age of Onset   Diabetes Mother    Hypertension Mother    Diabetes Father    Hypertension Father     Social History Social History   Tobacco Use   Smoking status: Former    Current packs/day: 0.00    Types: Cigarettes, E-cigarettes    Start date: 05/03/1997    Quit date: 05/03/2012    Years since quitting: 11.0   Smokeless tobacco: Never   Tobacco comments:    05/31/21 E CIG vape   Vaping Use   Vaping status: Every Day  Substance Use Topics   Alcohol use: No   Drug use: No    Comment: previous heroine addiction and narcotic addiction; Suboxone since 2013.     Allergies   Patient has no known allergies.   Review of Systems Review of Systems   Physical Exam Triage Vital  Signs ED Triage Vitals  Encounter Vitals Group     BP 06/06/23 1532 118/74     Systolic BP Percentile --      Diastolic BP Percentile --      Pulse Rate 06/06/23 1532 90     Resp 06/06/23 1532 16     Temp 06/06/23 1532 97.7 F (36.5 C)     Temp Source 06/06/23 1532 Oral     SpO2 06/06/23 1532 99 %     Weight --      Height --      Head Circumference --      Peak Flow --      Pain Score 06/06/23 1535 1     Pain Loc --      Pain Education --      Exclude from Growth Chart --    No data found.  Updated Vital Signs BP 118/74   Pulse 90   Temp 97.7 F (36.5 C) (Oral)   Resp 16   SpO2 99%      Physical Exam Constitutional:      General: He is not in acute distress.    Appearance: He is  well-developed. He is obese.  HENT:     Head: Normocephalic and atraumatic.  Eyes:     Conjunctiva/sclera: Conjunctivae normal.     Pupils: Pupils are equal, round, and reactive to light.  Cardiovascular:     Rate and Rhythm: Normal rate.  Pulmonary:     Effort: Pulmonary effort is normal. No respiratory distress.  Abdominal:     General: There is no distension.     Palpations: Abdomen is soft.  Musculoskeletal:        General: Normal range of motion.     Cervical back: Normal range of motion.  Skin:    General: Skin is warm and dry.     Comments: The first great toe has erythema from the DIP all around the tip.  Slight swelling.  Purulence of the lateral edge.  Draining.  Neurological:     Mental Status: He is alert.      UC Treatments / Results  Labs (all labs ordered are listed, but only abnormal results are displayed) Labs Reviewed - No data to display  EKG   Radiology No results found.  Procedures Procedures (including critical care time)  Medications Ordered in UC Medications - No data to display  Initial Impression / Assessment and Plan / UC Course  I have reviewed the triage vital signs and the nursing notes.  Pertinent labs & imaging results that were available during my care of the patient were reviewed by me and considered in my medical decision making (see chart for details).     Paronychia.  Discussed. Final Clinical Impressions(s) / UC Diagnoses   Final diagnoses:  Paronychia of great toe     Discharge Instructions      Your last tetanus was 2016.  It is due every 10 years Take the Duricef 2 times a day for a week to 10 days.  May stop when the infection is resolved Warm soaks 2 times a day for 10 to 20 minutes See a podiatrist if the problem persists    ED Prescriptions     Medication Sig Dispense Auth. Provider   cefadroxil (DURICEF) 500 MG capsule Take 1 capsule (500 mg total) by mouth 2 (two) times daily. 20 capsule Eustace Moore, MD      PDMP not reviewed this encounter.  Eustace Moore, MD 06/06/23 [phone]

## 2023-06-06 NOTE — ED Triage Notes (Signed)
C/o left 1st toe pain and ingrown toenail. Has been painful for 4-5 days. Toe is red.

## 2023-06-06 NOTE — Discharge Instructions (Addendum)
Your last tetanus was 2016.  It is due every 10 years Take the Duricef 2 times a day for a week to 10 days.  May stop when the infection is resolved Warm soaks 2 times a day for 10 to 20 minutes See a podiatrist if the problem persists

## 2023-06-08 ENCOUNTER — Other Ambulatory Visit (HOSPITAL_COMMUNITY): Payer: Self-pay

## 2023-06-19 ENCOUNTER — Ambulatory Visit: Payer: Medicaid Other | Admitting: Family Medicine

## 2023-06-19 ENCOUNTER — Encounter: Payer: Self-pay | Admitting: Family Medicine

## 2023-06-19 VITALS — BP 115/65 | HR 91 | Ht 72.0 in | Wt 301.0 lb

## 2023-06-19 DIAGNOSIS — E785 Hyperlipidemia, unspecified: Secondary | ICD-10-CM

## 2023-06-19 DIAGNOSIS — I10 Essential (primary) hypertension: Secondary | ICD-10-CM

## 2023-06-19 MED ORDER — WEGOVY 1 MG/0.5ML ~~LOC~~ SOAJ
1.0000 mg | SUBCUTANEOUS | 2 refills | Status: DC
Start: 2023-06-19 — End: 2023-08-24

## 2023-06-19 NOTE — Progress Notes (Signed)
BP 115/65   Pulse 91   Ht 6' (1.829 m)   Wt (!) 301 lb (136.5 kg)   SpO2 98%   BMI 40.82 kg/m    Subjective:   Patient ID: TRUE Darrell Henry, male    DOB: 02/25/1986, 37 y.o.   MRN: [id number]  HPI: Darrell Henry is a 37 y.o. male presenting on 06/19/2023 for Medical Management of Chronic Issues and Hypertension   HPI Hypertension Patient is currently on lisinopril and hydrochlorothiazide, and their blood pressure today is 115/65. Patient denies any lightheadedness or dizziness. Patient denies headaches, blurred vision, chest pains, shortness of breath, or weakness. Denies any side effects from medication and is content with current medication.   Hyperlipidemia Patient is coming in for recheck of his hyperlipidemia. The patient is currently taking no medicine, trying for diet control. They deny any issues with myalgias or history of liver damage from it. They deny any focal numbness or weakness or chest pain.   Obesity and weight management Patient's hypertension and hyperlipidemia are more complicated by the patient's morbid obesity.  Discussed weight loss and lifestyle modification and exercise with the patient.  He has lost 17 pounds in the past 3 months and is really focusing on diet and exercise and weight loss.  Relevant past medical, surgical, family and social history reviewed and updated as indicated. Interim medical history since our last visit reviewed. Allergies and medications reviewed and updated.  Review of Systems  Constitutional:  Negative for chills and fever.  Eyes:  Negative for visual disturbance.  Respiratory:  Negative for shortness of breath and wheezing.   Cardiovascular:  Negative for chest pain and leg swelling.  Musculoskeletal:  Negative for arthralgias, back pain and gait problem.  Skin:  Negative for color change and rash.  All other systems reviewed and are negative.   Per HPI unless specifically indicated above   Allergies as of 06/19/2023    No Known Allergies      Medication List        Accurate as of June 19, 2023  2:58 PM. If you have any questions, ask your nurse or doctor.          STOP taking these medications    cefadroxil 500 MG capsule Commonly known as: DURICEF Stopped by: Elige Radon Xariah Silvernail   Wegovy 0.25 MG/0.5ML Soaj Generic drug: Semaglutide-Weight Management Replaced by: Wegovy 1 MG/0.5ML Soaj You also have another medication with the same name that you need to continue taking as instructed. Stopped by: Elige Radon Trung Wenzl       TAKE these medications    Buprenorphine HCl-Naloxone HCl 8-2 MG Film Take 1 Film by mouth daily.   hydrochlorothiazide 25 MG tablet Commonly known as: HYDRODIURIL Take 1 tablet (25 mg total) by mouth in the morning. As needed for leg swelling   lisinopril 20 MG tablet Commonly known as: ZESTRIL Take 1 tablet (20 mg total) by mouth daily.   Semaglutide-Weight Management 1.7 MG/0.75ML Soaj Inject 1.7 mg into the skin once a week for 28 days. What changed:  Another medication with the same name was added. Make sure you understand how and when to take each. Another medication with the same name was removed. Continue taking this medication, and follow the directions you see here. Changed by: Elige Radon Cayman Kielbasa   [phone] 1 MG/0.5ML Soaj Generic drug: Semaglutide-Weight Management Inject 1 mg into the skin once a week. What changed: You were already taking a medication with the same name, and this  prescription was added. Make sure you understand how and when to take each. Replaces: Wegovy 0.25 MG/0.5ML Soaj Changed by: Elige Radon Brittania Sudbeck   Semaglutide-Weight Management 2.4 MG/0.75ML Soaj Inject 2.4 mg into the skin once a week for 28 days. Start taking on: July 18, 2023 What changed:  Another medication with the same name was added. Make sure you understand how and when to take each. Another medication with the same name was removed. Continue taking this  medication, and follow the directions you see here. Changed by: Elige Radon Jesselyn Rask         Objective:   BP 115/65   Pulse 91   Ht 6' (1.829 m)   Wt (!) 301 lb (136.5 kg)   SpO2 98%   BMI 40.82 kg/m   Wt Readings from Last 3 Encounters:  06/19/23 (!) 301 lb (136.5 kg)  03/24/23 (!) 318 lb (144.2 kg)  02/09/23 (!) 311 lb (141.1 kg)    Physical Exam Vitals and nursing note reviewed.  Constitutional:      General: He is not in acute distress.    Appearance: He is well-developed. He is not diaphoretic.  Eyes:     General: No scleral icterus.    Conjunctiva/sclera: Conjunctivae normal.  Neck:     Thyroid: No thyromegaly.  Cardiovascular:     Rate and Rhythm: Normal rate and regular rhythm.     Heart sounds: Normal heart sounds. No murmur heard. Pulmonary:     Effort: Pulmonary effort is normal. No respiratory distress.     Breath sounds: Normal breath sounds. No wheezing.  Musculoskeletal:        General: No swelling. Normal range of motion.     Cervical back: Neck supple.  Lymphadenopathy:     Cervical: No cervical adenopathy.  Skin:    General: Skin is warm and dry.     Findings: No rash.  Neurological:     Mental Status: He is alert and oriented to person, place, and time.     Coordination: Coordination normal.  Psychiatric:        Behavior: Behavior normal.       Assessment & Plan:   Problem List Items Addressed This Visit       Cardiovascular and Mediastinum   HTN (hypertension) - Primary   Relevant Medications   Semaglutide-Weight Management (WEGOVY) 1 MG/0.5ML SOAJ     Other   Dyslipidemia   Morbid obesity (HCC)   Relevant Medications   Semaglutide-Weight Management (WEGOVY) 1 MG/0.5ML SOAJ   Patient's BMI is >30 mg/m2.  Patient's current BMI is Body mass index is 40.82 kg/m.Marland Kitchen  Patient has lost and maintained a 5% body weight loss.  Patient is currently enrolled in a healthy eating plan along with encouraged exercise.   Patient does not have a  personal or family history of medullary thyroid carcinoma (MTC) or Multiple Endocrine Neoplasia syndrome type 2 (MEN 2).   Will increase Wegovy to 1 mg  Follow up plan: Return in about 3 months (around 09/19/2023), or if symptoms worsen or fail to improve, for Hypertension and obesity and weight loss.  Counseling provided for all of the vaccine components No orders of the defined types were placed in this encounter.   Arville Care, MD [facility] 06/19/2023, 2:58 PM

## 2023-08-06 ENCOUNTER — Other Ambulatory Visit: Payer: Self-pay | Admitting: Family Medicine

## 2023-08-06 DIAGNOSIS — I10 Essential (primary) hypertension: Secondary | ICD-10-CM

## 2023-08-07 ENCOUNTER — Other Ambulatory Visit (HOSPITAL_COMMUNITY): Payer: Self-pay

## 2023-08-07 ENCOUNTER — Other Ambulatory Visit: Payer: Self-pay

## 2023-08-07 MED ORDER — SEMAGLUTIDE-WEIGHT MANAGEMENT 1 MG/0.5ML ~~LOC~~ SOAJ
1.0000 mg | SUBCUTANEOUS | 0 refills | Status: DC
  Filled 2023-08-07: qty 2, 28d supply, fill #0

## 2023-08-08 ENCOUNTER — Other Ambulatory Visit (HOSPITAL_COMMUNITY): Payer: Self-pay

## 2023-08-18 ENCOUNTER — Other Ambulatory Visit (HOSPITAL_COMMUNITY): Payer: Self-pay

## 2023-08-23 ENCOUNTER — Other Ambulatory Visit: Payer: Self-pay | Admitting: Family Medicine

## 2023-08-23 NOTE — Telephone Encounter (Signed)
   Patient comment: Am I taking this 1mg  another month or is it time to bump up to 1.7 ? I need a wegovy  filled though. I'm almost out. Have been getting the wegovy  from [facility] on [address] [place]. Thanks Dr. Maryanne! Appreciate you!

## 2023-08-23 NOTE — Telephone Encounter (Signed)
 Please ask him if he has had any abdominal issues with the 1 mg, if he has not had no issues then we will bump him up to the 1.7

## 2023-08-24 ENCOUNTER — Other Ambulatory Visit (HOSPITAL_COMMUNITY): Payer: Self-pay

## 2023-08-24 ENCOUNTER — Encounter (HOSPITAL_COMMUNITY): Payer: Self-pay

## 2023-08-24 ENCOUNTER — Telehealth: Payer: Self-pay

## 2023-08-24 MED ORDER — SEMAGLUTIDE-WEIGHT MANAGEMENT 1.7 MG/0.75ML ~~LOC~~ SOAJ
1.7000 mg | SUBCUTANEOUS | 2 refills | Status: DC
  Filled 2023-08-24 – 2023-09-01 (×2): qty 3, 28d supply, fill #0

## 2023-08-24 NOTE — Telephone Encounter (Signed)
 Copied from CRM [id number]. Topic: Clinical - Prescription Issue >> Aug 24, 2023  9:15 AM Susanna ORN wrote: Reason for CRM: Patient called stating that he received a message that his medication for Semaglutide -Weight Management 1.7 MG/0.75ML SOAJ was denied & wants to know why. Advised that a PA is probably needed but will have nurse/provider to check. Patient would like a call back as well to let him know what's going on. CB #: [phone].

## 2023-08-24 NOTE — Telephone Encounter (Signed)
 Sent higher dose of 1.7 mg for the patient.

## 2023-08-25 ENCOUNTER — Other Ambulatory Visit (HOSPITAL_COMMUNITY): Payer: Self-pay

## 2023-08-25 ENCOUNTER — Telehealth: Payer: Self-pay

## 2023-08-25 NOTE — Telephone Encounter (Signed)
Pt made aware and understood. He has no further concerns.

## 2023-08-25 NOTE — Telephone Encounter (Signed)
 Pharmacy Patient Advocate Encounter   Received notification from Pt Calls Messages that prior authorization for WEGOVY  is required/requested.   Insurance verification completed.   The patient is insured through [facility] .   Per test claim: Refill too soon. PA is not needed at this time. Medication was filled 08/08/23. Next eligible fill date is around 09/04/23.   Per previous encounter, PA is effective from 03/29/23 to 09/25/23

## 2023-08-25 NOTE — Telephone Encounter (Signed)
 Per test claim, Wegovy 1mg  was filled on 08/08/23. Refill is too soon for 1.7mg . No PA needed at this time. PA is effective until 09/25/23.

## 2023-09-01 ENCOUNTER — Other Ambulatory Visit (HOSPITAL_COMMUNITY): Payer: Self-pay

## 2023-09-21 ENCOUNTER — Ambulatory Visit: Payer: Medicaid Other | Admitting: Family Medicine

## 2023-09-21 ENCOUNTER — Encounter: Payer: Self-pay | Admitting: Family Medicine

## 2023-09-25 ENCOUNTER — Ambulatory Visit: Payer: Medicaid Other | Admitting: Nurse Practitioner

## 2023-09-25 ENCOUNTER — Encounter: Payer: Self-pay | Admitting: Family Medicine

## 2023-09-25 ENCOUNTER — Ambulatory Visit: Payer: Medicaid Other | Admitting: Family Medicine

## 2023-09-25 VITALS — BP 130/73 | HR 130 | Ht 72.0 in | Wt 296.0 lb

## 2023-09-25 DIAGNOSIS — I10 Essential (primary) hypertension: Secondary | ICD-10-CM | POA: Diagnosis not present

## 2023-09-25 DIAGNOSIS — E785 Hyperlipidemia, unspecified: Secondary | ICD-10-CM | POA: Diagnosis not present

## 2023-09-25 DIAGNOSIS — R000 Tachycardia, unspecified: Secondary | ICD-10-CM

## 2023-09-25 LAB — LIPID PANEL

## 2023-09-25 MED ORDER — WEGOVY 2.4 MG/0.75ML ~~LOC~~ SOAJ
2.4000 mg | SUBCUTANEOUS | 3 refills | Status: DC
Start: 2023-09-25 — End: 2024-03-11

## 2023-09-25 NOTE — Progress Notes (Signed)
 BP 130/73   Pulse (!) 130   Ht 6' (1.829 m)   Wt 296 lb (134.3 kg)   SpO2 98%   BMI 40.14 kg/m    Subjective:   Patient ID: Darrell Henry, male    DOB: 16-Aug-1985, 38 y.o.   MRN: [id number]  HPI: Darrell Henry is a 38 y.o. male presenting on 09/25/2023 for Medical Management of Chronic Issues and Hypertension   HPI Hypertension Patient is currently on lisinopril -hydrochlorothiazide , and their blood pressure today is 130/73. Patient denies any lightheadedness or dizziness. Patient denies headaches, blurred vision, chest pains, shortness of breath, or weakness. Denies any side effects from medication and is content with current medication.   Hyperlipidemia Patient is coming in for recheck of his hyperlipidemia. The patient is currently taking no medicine currently, diet control. They deny any issues with myalgias or history of liver damage from it. They deny any focal numbness or weakness or chest pain.   Obesity recheck Patient is coming in today for obesity recheck.  He has been taking Wegovy  1.7.  Losing weight on Wegovy .  He gets a little indigestion but feels like it is doing well, would like to go up on the medication to continue to help.  His weight has continued to come down and he is down 5 pounds from last time.  Relevant past medical, surgical, family and social history reviewed and updated as indicated. Interim medical history since our last visit reviewed. Allergies and medications reviewed and updated.  Review of Systems  Constitutional:  Negative for chills and fever.  Respiratory:  Negative for shortness of breath and wheezing.   Cardiovascular:  Positive for palpitations. Negative for chest pain and leg swelling.  Musculoskeletal:  Negative for back pain and gait problem.  Skin:  Negative for rash.  All other systems reviewed and are negative.   Per HPI unless specifically indicated above   Allergies as of 09/25/2023   No Known Allergies      Medication  List        Accurate as of September 25, 2023  4:49 PM. If you have any questions, ask your nurse or doctor.          STOP taking these medications    Wegovy  1.7 MG/0.75ML Soaj Generic drug: Semaglutide -Weight Management Replaced by: Wegovy  2.4 MG/0.75ML Soaj Stopped by: Melodie Spry A Elven Laboy       TAKE these medications    Buprenorphine  HCl-Naloxone  HCl 8-2 MG Film Take 1 Film by mouth daily.   hydrochlorothiazide  25 MG tablet Commonly known as: HYDRODIURIL  Take 1 tablet (25 mg total) by mouth in the morning. As needed for leg swelling   lisinopril  20 MG tablet Commonly known as: ZESTRIL  Take 1 tablet (20 mg total) by mouth daily.   Wegovy  2.4 MG/0.75ML Soaj Generic drug: Semaglutide -Weight Management Inject 2.4 mg into the skin once a week. Replaces: Wegovy  1.7 MG/0.75ML Soaj Started by: Lucio Sabin Lissie Hinesley         Objective:   BP 130/73   Pulse (!) 130   Ht 6' (1.829 m)   Wt 296 lb (134.3 kg)   SpO2 98%   BMI 40.14 kg/m   Wt Readings from Last 3 Encounters:  09/25/23 296 lb (134.3 kg)  06/19/23 (!) 301 lb (136.5 kg)  03/24/23 (!) 318 lb (144.2 kg)    Physical Exam Vitals and nursing note reviewed.  Constitutional:      General: He is not in acute distress.    Appearance: He  is well-developed. He is not diaphoretic.  Eyes:     General: No scleral icterus.    Conjunctiva/sclera: Conjunctivae normal.  Neck:     Thyroid : No thyromegaly.  Cardiovascular:     Rate and Rhythm: Regular rhythm. Tachycardia present.     Heart sounds: Normal heart sounds. No murmur heard. Pulmonary:     Effort: Pulmonary effort is normal. No respiratory distress.     Breath sounds: Normal breath sounds. No wheezing, rhonchi or rales.  Musculoskeletal:        General: Swelling (1+ peripheral edema) present. Normal range of motion.     Cervical back: Neck supple.  Lymphadenopathy:     Cervical: No cervical adenopathy.  Skin:    General: Skin is warm and dry.      Findings: No rash.  Neurological:     Mental Status: He is alert and oriented to person, place, and time.     Coordination: Coordination normal.  Psychiatric:        Behavior: Behavior normal.     EKG: Sinus tachycardia axis deviation.  Assessment & Plan:   Problem List Items Addressed This Visit       Cardiovascular and Mediastinum   HTN (hypertension)   Relevant Orders   CMP14+EGFR   Lipid panel     Other   Dyslipidemia - Primary   Relevant Orders   CMP14+EGFR   Lipid panel   Morbid obesity (HCC)   Relevant Medications   Semaglutide -Weight Management (WEGOVY ) 2.4 MG/0.75ML SOAJ   Other Visit Diagnoses       Tachycardia       Relevant Orders   CBC with Differential/Platelet   TSH   EKG 12-Lead (Completed)     Sinus tachycardia likely just due to agitation coming in here, will monitor in the future but no major changes  Increased Wegovy  to 2.4 mg, seems to be doing well.  Follow up plan: Return in about 6 months (around 03/24/2024), or if symptoms worsen or fail to improve, for Hypertension and hyperlipidemia and obesity.  Counseling provided for all of the vaccine components Orders Placed This Encounter  Procedures   CBC with Differential/Platelet   CMP14+EGFR   Lipid panel   TSH   EKG 12-Lead    Jolyne Needs, MD [facility]ick[facility] 09/25/2023, 4:49 PM

## 2023-09-26 LAB — CMP14+EGFR
ALT: 18 IU/L (ref 0–44)
AST: 26 IU/L (ref 0–40)
Albumin: 4 g/dL — ABNORMAL LOW (ref 4.1–5.1)
Alkaline Phosphatase: 56 IU/L (ref 44–121)
BUN/Creatinine Ratio: 23 — ABNORMAL HIGH (ref 9–20)
BUN: 19 mg/dL (ref 6–20)
Bilirubin Total: 0.4 mg/dL (ref 0.0–1.2)
CO2: 25 mmol/L (ref 20–29)
Calcium: 9.4 mg/dL (ref 8.7–10.2)
Chloride: 94 mmol/L — ABNORMAL LOW (ref 96–106)
Creatinine, Ser: 0.82 mg/dL (ref 0.76–1.27)
Globulin, Total: 3.5 g/dL (ref 1.5–4.5)
Glucose: 122 mg/dL — ABNORMAL HIGH (ref 70–99)
Potassium: 4.3 mmol/L (ref 3.5–5.2)
Sodium: 138 mmol/L (ref 134–144)
Total Protein: 7.5 g/dL (ref 6.0–8.5)
eGFR: 115 mL/min/{1.73_m2} (ref >59)

## 2023-09-26 LAB — CBC WITH DIFFERENTIAL/PLATELET
Basophils Absolute: 0 10*3/uL (ref 0.0–0.2)
Basos: 0 %
EOS (ABSOLUTE): 0 10*3/uL (ref 0.0–0.4)
Eos: 1 %
Hematocrit: 38.4 % (ref 37.5–51.0)
Hemoglobin: 12.7 g/dL — ABNORMAL LOW (ref 13.0–17.7)
Immature Grans (Abs): 0 10*3/uL (ref 0.0–0.1)
Immature Granulocytes: 0 %
Lymphocytes Absolute: 1.2 10*3/uL (ref 0.7–3.1)
Lymphs: 14 %
MCH: 26.8 pg (ref 26.6–33.0)
MCHC: 33.1 g/dL (ref 31.5–35.7)
MCV: 81 fL (ref 79–97)
Monocytes Absolute: 0.5 10*3/uL (ref 0.1–0.9)
Monocytes: 6 %
Neutrophils Absolute: 6.8 10*3/uL (ref 1.4–7.0)
Neutrophils: 79 %
Platelets: 462 10*3/uL — ABNORMAL HIGH (ref 150–450)
RBC: 4.73 x10E6/uL (ref 4.14–5.80)
RDW: 13.2 % (ref 11.6–15.4)
WBC: 8.7 10*3/uL (ref 3.4–10.8)

## 2023-09-26 LAB — LIPID PANEL
Cholesterol, Total: 123 mg/dL (ref 100–199)
HDL: 36 mg/dL — ABNORMAL LOW (ref >39)
LDL CALC COMMENT:: 3.4 ratio (ref 0.0–5.0)
LDL Chol Calc (NIH): 69 mg/dL (ref 0–99)
Triglycerides: 91 mg/dL (ref 0–149)
VLDL Cholesterol Cal: 18 mg/dL (ref 5–40)

## 2023-09-26 LAB — TSH: TSH: 1.37 u[IU]/mL (ref 0.450–4.500)

## 2023-09-28 ENCOUNTER — Other Ambulatory Visit: Payer: Self-pay | Admitting: Family Medicine

## 2023-09-28 DIAGNOSIS — I10 Essential (primary) hypertension: Secondary | ICD-10-CM

## 2023-10-05 ENCOUNTER — Encounter: Payer: Self-pay | Admitting: Family Medicine

## 2023-10-23 ENCOUNTER — Telehealth: Payer: Self-pay | Admitting: Pharmacy Technician

## 2023-10-23 ENCOUNTER — Other Ambulatory Visit (HOSPITAL_COMMUNITY): Payer: Self-pay

## 2023-10-23 NOTE — Telephone Encounter (Signed)
 Pharmacy Patient Advocate Encounter  Received notification from [facility] that Prior Authorization for Wegovy 2.4MG /0.75ML auto-injectors  has been APPROVED from 10/23/2023 to 10/22/2024. Ran test claim, Copay is $4.00. This test claim was processed through [facility]- copay amounts may vary at other pharmacies due to pharmacy/plan contracts, or as the patient moves through the different stages of their insurance plan.   PA #/Case ID/Reference #: [id number]

## 2023-10-23 NOTE — Telephone Encounter (Signed)
 Pharmacy Patient Advocate Encounter   Received notification from CoverMyMeds that prior authorization for [facility] 2.4MG /0.75ML auto-injectors is required/requested.   Insurance verification completed.   The patient is insured through [facility] .   Per test claim: PA required; PA submitted to above mentioned insurance via CoverMyMeds Key/confirmation #/EOC [facility] Status is pending

## 2023-12-04 ENCOUNTER — Other Ambulatory Visit (HOSPITAL_COMMUNITY): Payer: Self-pay | Admitting: Neurosurgery

## 2023-12-04 DIAGNOSIS — G992 Myelopathy in diseases classified elsewhere: Secondary | ICD-10-CM

## 2023-12-13 ENCOUNTER — Ambulatory Visit (HOSPITAL_COMMUNITY)
Admission: RE | Admit: 2023-12-13 | Discharge: 2023-12-13 | Disposition: A | Discharge status: Home / Self Care | Source: Ambulatory Visit | Attending: Neurosurgery | Admitting: Neurosurgery

## 2023-12-13 ENCOUNTER — Ambulatory Visit (HOSPITAL_COMMUNITY): Admission: RE | Admit: 2023-12-13 | Source: Ambulatory Visit

## 2023-12-13 DIAGNOSIS — G959 Disease of spinal cord, unspecified: Secondary | ICD-10-CM | POA: Diagnosis present

## 2023-12-13 DIAGNOSIS — G992 Myelopathy in diseases classified elsewhere: Secondary | ICD-10-CM

## 2023-12-22 ENCOUNTER — Other Ambulatory Visit: Payer: Self-pay | Admitting: Family Medicine

## 2023-12-22 DIAGNOSIS — R600 Localized edema: Secondary | ICD-10-CM

## 2024-01-31 ENCOUNTER — Telehealth: Payer: Self-pay

## 2024-01-31 NOTE — Telephone Encounter (Signed)
 Copied from CRM [id number]. Topic: Clinical - Prescription Issue >> Jan 31, 2024 10:47 AM Tiffany H wrote: Reason for CRM: Patient called to advise that Wegovy  pen malfunctioned and shot all its liquid across the room post injection. Injection stalled. Patient will need a replacement pen. Please advise and update pharmacy.   Semaglutide -Weight Management (WEGOVY ) 2.4 MG/0.75ML SOAJ [[id number]]  [id number] - [place], [place] - [address] [place] [address] Phone: [phone]  Fax: [phone] DEA #: [id number] DAW Reason: --

## 2024-01-31 NOTE — Telephone Encounter (Signed)
 The pharmacy will not likely be able to fill the pen under your insurance so you would have to go with the full self-pay price if you went through the pharmacy, often times when you have something malfunction like this the best option is to contact the pharmaceutical company directly and inform them that you have a malfunction and they will usually send you a replacement for free.

## 2024-01-31 NOTE — Telephone Encounter (Signed)
 Spoke to patient. He will contact pharmaceutical company and see if they can ship a replacement.

## 2024-03-10 ENCOUNTER — Other Ambulatory Visit: Payer: Self-pay | Admitting: Family Medicine

## 2024-03-21 ENCOUNTER — Other Ambulatory Visit: Payer: Self-pay | Admitting: Family Medicine

## 2024-03-21 DIAGNOSIS — I10 Essential (primary) hypertension: Secondary | ICD-10-CM

## 2024-03-25 ENCOUNTER — Ambulatory Visit: Payer: Medicaid Other | Admitting: Family Medicine

## 2024-04-04 ENCOUNTER — Other Ambulatory Visit: Payer: Self-pay | Admitting: Family Medicine

## 2024-04-25 ENCOUNTER — Ambulatory Visit: Admitting: Family Medicine

## 2024-04-25 ENCOUNTER — Encounter: Payer: Self-pay | Admitting: Family Medicine

## 2024-04-25 VITALS — BP 102/70 | HR 117 | Ht 72.0 in | Wt 279.0 lb

## 2024-04-25 DIAGNOSIS — I10 Essential (primary) hypertension: Secondary | ICD-10-CM | POA: Diagnosis not present

## 2024-04-25 DIAGNOSIS — R600 Localized edema: Secondary | ICD-10-CM

## 2024-04-25 DIAGNOSIS — L700 Acne vulgaris: Secondary | ICD-10-CM

## 2024-04-25 DIAGNOSIS — R0981 Nasal congestion: Secondary | ICD-10-CM

## 2024-04-25 DIAGNOSIS — E785 Hyperlipidemia, unspecified: Secondary | ICD-10-CM | POA: Diagnosis not present

## 2024-04-25 MED ORDER — BENZOYL PEROXIDE 10 % EX LIQD: 1.0000 | Freq: Every day | CUTANEOUS | 1 refills | Status: DC

## 2024-04-25 MED ORDER — WEGOVY 2.4 MG/0.75ML ~~LOC~~ SOAJ: 2.4000 mg | SUBCUTANEOUS | 3 refills | Status: DC

## 2024-04-25 MED ORDER — HYDROCHLOROTHIAZIDE 25 MG PO TABS: 25.0000 mg | ORAL_TABLET | Freq: Every morning | ORAL | 3 refills | Status: AC

## 2024-04-25 NOTE — Progress Notes (Signed)
 BP 102/70   Pulse (!) 117   Ht 6' (1.829 m)   Wt 279 lb (126.6 kg)   SpO2 95%   BMI 37.84 kg/m    Subjective:   Patient ID: Darrell Henry, male    DOB: Aug 22, 1985, 38 y.o.   MRN: [id number]  HPI: Darrell Henry is a 38 y.o. male presenting on 04/25/2024 for Medical Management of Chronic Issues, Hypertension, and Obesity   Discussed the use of AI scribe software for clinical note transcription with the patient, who gave verbal consent to proceed.  History of Present Illness   [name] is a 38 year old male with hypertension and hyperlipidemia who presents for a recheck of blood pressure, cholesterol, and weight management.  He continues to use Wegovy  for weight management but notes a plateau in weight loss despite being on the highest dose. He has lost over fifteen pounds in the past six months, although there has been a slight return of appetite in the last month or two.  He experiences lightheadedness when standing up quickly. He also reports occasional swelling in his legs, which has improved. He is currently taking hydrochlorothiazide .  He has recently developed acne, describing it as 'awful,' with daily pimples on his forehead, side of his head, and around and behind his ears. He has not tried any treatments for this condition yet.          Relevant past medical, surgical, family and social history reviewed and updated as indicated. Interim medical history since our last visit reviewed. Allergies and medications reviewed and updated.  Review of Systems  Constitutional:  Negative for chills and fever.  Eyes:  Negative for visual disturbance.  Respiratory:  Negative for shortness of breath and wheezing.   Cardiovascular:  Positive for leg swelling. Negative for chest pain and palpitations.  Musculoskeletal:  Negative for back pain and gait problem.  Skin:  Negative for rash.  Neurological:  Positive for light-headedness.  Psychiatric/Behavioral:  Negative for  decreased concentration and dysphoric mood.   All other systems reviewed and are negative.   Per HPI unless specifically indicated above   Allergies as of 04/25/2024   No Known Allergies      Medication List        Accurate as of April 25, 2024  3:04 PM. If you have any questions, ask your nurse or doctor.          STOP taking these medications    lisinopril  20 MG tablet Commonly known as: ZESTRIL  Stopped by: Fonda LABOR Verner Mccrone       TAKE these medications    Benzoyl Peroxide  10 % Liqd Commonly known as: Acne Foaming Wash Apply 1 Application topically at bedtime. Started by: Fonda LABOR Corrinna Karapetyan   Buprenorphine  HCl-Naloxone  HCl 8-2 MG Film Take 1 Film by mouth daily.   hydrochlorothiazide  25 MG tablet Commonly known as: HYDRODIURIL  Take 1 tablet (25 mg total) by mouth in the morning. As needed for leg swelling   Wegovy  2.4 MG/0.75ML Soaj SQ injection Generic drug: semaglutide -weight management Inject 2.4 mg into the skin once a week.         Objective:   BP 102/70   Pulse (!) 117   Ht 6' (1.829 m)   Wt 279 lb (126.6 kg)   SpO2 95%   BMI 37.84 kg/m   Wt Readings from Last 3 Encounters:  04/25/24 279 lb (126.6 kg)  09/25/23 296 lb (134.3 kg)  06/19/23 (!) 301 lb (136.5 kg)  Physical Exam Vitals and nursing note reviewed.  Constitutional:      General: He is not in acute distress.    Appearance: He is well-developed. He is not diaphoretic.  Eyes:     General: No scleral icterus.    Conjunctiva/sclera: Conjunctivae normal.  Neck:     Thyroid : No thyromegaly.  Cardiovascular:     Rate and Rhythm: Normal rate and regular rhythm.     Heart sounds: Normal heart sounds. No murmur heard. Pulmonary:     Effort: Pulmonary effort is normal. No respiratory distress.     Breath sounds: Normal breath sounds. No wheezing.  Musculoskeletal:        General: Swelling (1+ bilateral lower extremity) present. Normal range of motion.     Cervical back:  Neck supple.  Lymphadenopathy:     Cervical: No cervical adenopathy.  Skin:    General: Skin is warm and dry.     Findings: No rash.  Neurological:     Mental Status: He is alert and oriented to person, place, and time.     Coordination: Coordination normal.  Psychiatric:        Behavior: Behavior normal.    Physical Exam   SKIN: Acne on forehead, side of head, around and behind ears.         Assessment & Plan:   Problem List Items Addressed This Visit       Cardiovascular and Mediastinum   HTN (hypertension) - Primary   Relevant Medications   hydrochlorothiazide  (HYDRODIURIL ) 25 MG tablet   Other Relevant Orders   CBC with Differential/Platelet   CMP14+EGFR   Lipid panel     Other   Dyslipidemia   Relevant Orders   CBC with Differential/Platelet   CMP14+EGFR   Lipid panel   Morbid obesity (HCC)   Relevant Medications   semaglutide -weight management (WEGOVY ) 2.4 MG/0.75ML SOAJ SQ injection   Other Relevant Orders   CBC with Differential/Platelet   CMP14+EGFR   Lipid panel   Other Visit Diagnoses       Bilateral leg edema       Relevant Medications   hydrochlorothiazide  (HYDRODIURIL ) 25 MG tablet     Acne vulgaris       Relevant Medications   Benzoyl Peroxide  (ACNE FOAMING WASH) 10 % LIQD     Chronic nasal congestion       Relevant Orders   Ambulatory referral to ENT         Morbid obesity due to excess calories Weight loss plateau on maximum Wegovy  dose with slight return of appetite. - Continue Wegovy  at current dose. - Monitor weight and appetite. - Consider tapering Wegovy  if weight loss plateau persists.  Essential hypertension Blood pressure low with orthostatic symptoms likely due to weight loss. - Discontinue lisinopril . - Continue hydrochlorothiazide . - Monitor blood pressure regularly.  Hyperlipidemia No changes in management discussed.  Acne New onset acne on forehead, side of head, and around ears without prior treatment. -  Recommend over-the-counter acne treatments such as Retin A cream.      Patient's BMI is >30 mg/m2.  Patient's current BMI is Body mass index is 37.84 kg/m.SABRA  Patient has lost and maintained a 5% body weight loss.  Patient is currently enrolled in a healthy eating plan along with encouraged exercise.   Patient does not have a personal or family history of medullary thyroid  carcinoma (MTC) or Multiple Endocrine Neoplasia syndrome type 2 (MEN 2).     Follow up plan: Return in  about 6 months (around 10/23/2024), or if symptoms worsen or fail to improve, for Physical exam and hypertension and morbid obesity and dyslipidemia.  Counseling provided for all of the vaccine components Orders Placed This Encounter  Procedures   CBC with Differential/Platelet   CMP14+EGFR   Lipid panel   Ambulatory referral to ENT    Fonda Levins, MD [facility] 04/25/2024, 3:04 PM

## 2024-04-26 LAB — CBC WITH DIFFERENTIAL/PLATELET
Basophils Absolute: 0 x10E3/uL (ref 0.0–0.2)
Basos: 1 %
EOS (ABSOLUTE): 0.1 x10E3/uL (ref 0.0–0.4)
Eos: 1 %
Hematocrit: 40.5 % (ref 37.5–51.0)
Hemoglobin: 13 g/dL (ref 13.0–17.7)
Immature Grans (Abs): 0 x10E3/uL (ref 0.0–0.1)
Immature Granulocytes: 0 %
Lymphocytes Absolute: 1.5 x10E3/uL (ref 0.7–3.1)
Lymphs: 17 %
MCH: 26.5 pg — ABNORMAL LOW (ref 26.6–33.0)
MCHC: 32.1 g/dL (ref 31.5–35.7)
MCV: 83 fL (ref 79–97)
Monocytes Absolute: 0.6 x10E3/uL (ref 0.1–0.9)
Monocytes: 7 %
Neutrophils Absolute: 6.3 x10E3/uL (ref 1.4–7.0)
Neutrophils: 74 %
Platelets: 462 x10E3/uL — ABNORMAL HIGH (ref 150–450)
RBC: 4.9 x10E6/uL (ref 4.14–5.80)
RDW: 13.8 % (ref 11.6–15.4)
WBC: 8.5 x10E3/uL (ref 3.4–10.8)

## 2024-04-26 LAB — CMP14+EGFR
ALT: 14 IU/L (ref 0–44)
AST: 19 IU/L (ref 0–40)
Albumin: 4.1 g/dL (ref 4.1–5.1)
Alkaline Phosphatase: 52 IU/L (ref 44–121)
BUN/Creatinine Ratio: 18 (ref 9–20)
BUN: 18 mg/dL (ref 6–20)
Bilirubin Total: 0.3 mg/dL (ref 0.0–1.2)
CO2: 27 mmol/L (ref 20–29)
Calcium: 9.6 mg/dL (ref 8.7–10.2)
Chloride: 97 mmol/L (ref 96–106)
Creatinine, Ser: 0.98 mg/dL (ref 0.76–1.27)
Globulin, Total: 3.3 g/dL (ref 1.5–4.5)
Glucose: 98 mg/dL (ref 70–99)
Potassium: 4.5 mmol/L (ref 3.5–5.2)
Sodium: 139 mmol/L (ref 134–144)
Total Protein: 7.4 g/dL (ref 6.0–8.5)
eGFR: 101 mL/min/1.73 (ref >59)

## 2024-04-26 LAB — LIPID PANEL
Chol/HDL Ratio: 3.7 ratio (ref 0.0–5.0)
Cholesterol, Total: 137 mg/dL (ref 100–199)
HDL: 37 mg/dL — ABNORMAL LOW (ref >39)
LDL Chol Calc (NIH): 78 mg/dL (ref 0–99)
Triglycerides: 123 mg/dL (ref 0–149)
VLDL Cholesterol Cal: 22 mg/dL (ref 5–40)

## 2024-05-01 ENCOUNTER — Ambulatory Visit: Payer: Self-pay | Admitting: Family Medicine

## 2024-05-28 ENCOUNTER — Telehealth: Payer: Self-pay

## 2024-05-28 NOTE — Telephone Encounter (Signed)
 MADE PATIENT AWARE THAT AS OF OCTOBER 1ST ALL MEDICAID PLANS ARE NOT COVERING WEIGHT LOSS MEDS. YOU HAVE TO BE DIABETIC OR HAVE A HEART CONDITION IN ORDER FOR MEDICAID TO APPROVE. PATIENT WANTS TO KNOW IF THERE ARE ANY OTHER OPTIONS FOR HIM? HE IS PRE DIABETIC.

## 2024-05-28 NOTE — Telephone Encounter (Signed)
 Copied from CRM [id number]. Topic: Clinical - Medication Question >> May 28, 2024  9:41 AM Willma R wrote: Reason for CRM: Patient is prescribed semaglutide -weight management (WEGOVY ) 2.4 MG/0.75ML SOAJ SQ injection. States his insurance company will no longer cover it. Is requesting to see if he would be able to be prescribed Ozempic .  Patient can be reached at [phone]

## 2024-05-29 NOTE — Telephone Encounter (Signed)
 Unfortunately prediabetes will not get medications covered, only full-blown diabetes.  This is the first that I am hearing that they made that change from covering it to now not covering it and unfortunately if they have said that they are making that change then we may not have a lot of options.  With what ever pens you have left I would not dial them back and only give half of an injection each time to hopefully space them out a little bit more.

## 2024-05-29 NOTE — Telephone Encounter (Signed)
 Pt made aware and understood. States that the Wegovy  pen does not allow half of dose. He will complete what he has for now. No further concerns.

## 2024-06-06 NOTE — Telephone Encounter (Signed)
 Appt made for 11/5 at 11:10am. Pt aware.

## 2024-06-06 NOTE — Telephone Encounter (Unsigned)
 Copied from CRM #[id number]. Topic: Clinical - Prescription Issue >> Jun 06, 2024  4:06 PM Turkey B wrote: Reason for CRM: Patient says Wegovy  isnt being covered under his insurance. Patient states he found out Zepbound would be covered for persons with sleep apnea, he says he has this and borderline diabetes, so should be able to get Zepbound. Please cb to discuss

## 2024-06-06 NOTE — Telephone Encounter (Signed)
 Okay we can definitely try for that, did get it covered by insurance we will have to document the sleep apnea along with the obesity during a visit so if he wants to try for him to come in for a visit we can document all of that and then try and send it based off that visit

## 2024-06-19 ENCOUNTER — Ambulatory Visit: Payer: Self-pay | Admitting: Family Medicine

## 2024-07-10 ENCOUNTER — Encounter: Payer: Self-pay | Admitting: Family Medicine

## 2024-07-10 ENCOUNTER — Telehealth: Payer: Self-pay

## 2024-07-10 ENCOUNTER — Other Ambulatory Visit (HOSPITAL_COMMUNITY): Payer: Self-pay

## 2024-07-10 ENCOUNTER — Telehealth: Payer: Self-pay | Admitting: Family Medicine

## 2024-07-10 ENCOUNTER — Ambulatory Visit: Admitting: Family Medicine

## 2024-07-10 DIAGNOSIS — Z6838 Body mass index (BMI) 38.0-38.9, adult: Secondary | ICD-10-CM | POA: Diagnosis not present

## 2024-07-10 DIAGNOSIS — G4733 Obstructive sleep apnea (adult) (pediatric): Secondary | ICD-10-CM

## 2024-07-10 MED ORDER — TIRZEPATIDE-WEIGHT MANAGEMENT 5 MG/0.5ML ~~LOC~~ SOLN: 5.0000 mg | SUBCUTANEOUS | 1 refills | Status: DC

## 2024-07-10 MED ORDER — TIRZEPATIDE-WEIGHT MANAGEMENT 2.5 MG/0.5ML ~~LOC~~ SOLN: 2.5000 mg | SUBCUTANEOUS | 0 refills | Status: AC

## 2024-07-10 NOTE — Telephone Encounter (Signed)
 ZEPBOUND  2.5 MG/0.5ML Pen  Pharmacy comment: Alternative Requested:PEIOE AUTH REQUIRED

## 2024-07-10 NOTE — Progress Notes (Signed)
 BP 137/84   Pulse (!) 101   Temp 97.7 F (36.5 C)   Ht 6' (1.829 m)   Wt 285 lb (129.3 kg)   SpO2 97%   BMI 38.65 kg/m    Subjective:   Patient ID: Darrell Henry, male    DOB: 1986-07-26, 38 y.o.   MRN: [id number]  HPI: Darrell Henry is a 38 y.o. male presenting on 07/10/2024 for Medical Management of Chronic Issues and Obesity   Discussed the use of AI scribe software for clinical note transcription with the patient, who gave verbal consent to proceed.  History of Present Illness   Darrell Henry is a 38 year old male with severe sleep apnea who presents for obesity management and weight loss medication.  Obesity and weight management - Obesity with ongoing efforts for weight loss management. - Previously used Wegovy  for weight loss, discontinued due to lack of insurance coverage. - Currently considering Zepbound  and in process of obtaining insurance approval.  Sleep apnea - Severe sleep apnea confirmed by home sleep test. - Home sleep test showed 91 apnea events and 209 hypopnea events. - Oxygen saturation dropped to 70% during sleep study.  Lower extremity edema and drainage - Leg swelling and drainage present. - Associates these symptoms with current health issues.  Gait disturbance and musculoskeletal symptoms - Difficulty walking normally. - Attributes gait disturbance to prior neck surgery, which did not alleviate symptoms. - Interested in further evaluation of lower back as a possible contributor to mobility issues.  Upper respiratory symptoms - Recent cold with chest congestion and wheezing. - Managing symptoms with Mucinex. - Cold symptoms nearly resolved.  Nasal obstruction - Seeking evaluation for deviated septum. - Has not yet received referral for further management.          Relevant past medical, surgical, family and social history reviewed and updated as indicated. Interim medical history since our last visit reviewed. Allergies and  medications reviewed and updated.  Review of Systems  Constitutional:  Negative for chills and fever.  HENT:  Positive for congestion.   Eyes:  Negative for discharge.  Respiratory:  Positive for wheezing. Negative for shortness of breath.   Cardiovascular:  Positive for leg swelling. Negative for chest pain.  Musculoskeletal:  Positive for arthralgias, back pain and gait problem.  Skin:  Negative for rash.  All other systems reviewed and are negative.   Per HPI unless specifically indicated above   Allergies as of 07/10/2024   No Known Allergies      Medication List        Accurate as of July 10, 2024  9:50 AM. If you have any questions, ask your nurse or doctor.          STOP taking these medications    Benzoyl Peroxide  10 % Liqd Commonly known as: Acne Foaming Wash Stopped by: Fonda LABOR Shavar Gorka   Wegovy  2.4 MG/0.75ML Soaj SQ injection Generic drug: semaglutide -weight management Stopped by: Fonda LABOR Sherrelle Prochazka       TAKE these medications    Buprenorphine  HCl-Naloxone  HCl 8-2 MG Film Take 1 Film by mouth daily.   hydrochlorothiazide  25 MG tablet Commonly known as: HYDRODIURIL  Take 1 tablet (25 mg total) by mouth in the morning. As needed for leg swelling   tirzepatide  2.5 MG/0.5ML injection vial Commonly known as: ZEPBOUND  Inject 2.5 mg into the skin once a week. Started by: Fonda LABOR Marry Kusch   tirzepatide  5 MG/0.5ML injection vial Inject 5 mg into the skin once a  week. Started by: Fonda LABOR Huie Ghuman         Objective:   BP 137/84   Pulse (!) 101   Temp 97.7 F (36.5 C)   Ht 6' (1.829 m)   Wt 285 lb (129.3 kg)   SpO2 97%   BMI 38.65 kg/m   Wt Readings from Last 3 Encounters:  07/10/24 285 lb (129.3 kg)  04/25/24 279 lb (126.6 kg)  09/25/23 296 lb (134.3 kg)    Physical Exam Vitals and nursing note reviewed.  Constitutional:      Appearance: Normal appearance. He is obese.  Cardiovascular:     Rate and Rhythm: Normal rate  and regular rhythm.  Pulmonary:     Effort: Pulmonary effort is normal.     Breath sounds: Wheezing present. No rhonchi or rales.  Neurological:     Mental Status: He is alert.    Physical Exam   VITALS: BP- 137/84 CHEST: Wheezing present.         Assessment & Plan:   Problem List Items Addressed This Visit       Respiratory   Severe obstructive sleep apnea   Relevant Medications   tirzepatide  (ZEPBOUND ) 2.5 MG/0.5ML injection vial   tirzepatide  5 MG/0.5ML injection vial     Other   Morbid obesity (HCC) - Primary   Relevant Medications   tirzepatide  (ZEPBOUND ) 2.5 MG/0.5ML injection vial   tirzepatide  5 MG/0.5ML injection vial      Morbid obesity due to excess calories Zepbound  considered for weight loss, requires severe sleep apnea documentation for coverage. - Submit prior authorization for Zepbound  with severe sleep apnea documentation. - Instructed him to upload sleep study results to MyChart for insurance purposes.  Severe obstructive sleep apnea Confirmed by home sleep test. Supports need for weight loss medication coverage. - Documented severe sleep apnea in medical record. - Ensure sleep study results are uploaded to MyChart for insurance purposes.  Lower extremity edema Swelling in legs with drainage, no specific cause identified.  Chest congestion, resolving - Recommended Mucinex. - Advised to report if symptoms persist for potential inhaler prescription.      Patient's BMI is >30 mg/m2.  Patient's current BMI is Body mass index is 38.65 kg/m.SABRA  Patient is currently enrolled in a healthy eating plan along with encouraged exercise. Patient has contraindications to phentermine, Contrave & Qsymia (contains phentermine).  Patient does not have a personal or family history of medullary thyroid  carcinoma (MTC) or Multiple Endocrine Neoplasia syndrome type 2 (MEN 2).     Follow up plan: Return in about 3 months (around 10/10/2024), or if symptoms worsen or  fail to improve, for Obesity and weight.  Counseling provided for all of the vaccine components No orders of the defined types were placed in this encounter.   Fonda Levins, MD [facility] 07/10/2024, 9:50 AM

## 2024-07-10 NOTE — Telephone Encounter (Signed)
 Pharmacy Patient Advocate Encounter   Received notification from Physician's Office that prior authorization for Zepbound  2.5MG /0.5ML pen-injectors  is required/requested.   Insurance verification completed.   The patient is insured through [employer].   Per test claim: PA required; PA submitted to above mentioned insurance via Latent Key/confirmation #/EOC [id number] Status is pending

## 2024-07-15 ENCOUNTER — Encounter: Payer: Self-pay | Admitting: Family Medicine

## 2024-07-15 MED ORDER — SILDENAFIL CITRATE 20 MG PO TABS: 20.0000 mg | ORAL_TABLET | ORAL | 1 refills | Status: AC | PRN

## 2024-07-16 NOTE — Telephone Encounter (Signed)
 PA request has been Submitted. New Encounter has been or will be created for follow up. For additional info see Pharmacy Prior Auth telephone encounter from 07/10/24.

## 2024-07-17 ENCOUNTER — Other Ambulatory Visit (HOSPITAL_COMMUNITY): Payer: Self-pay

## 2024-07-17 NOTE — Telephone Encounter (Signed)
 Pharmacy Patient Advocate Encounter  Received notification from [employer] that Prior Authorization for Zepbound  2.5MG /0.5ML pen-injectors  has been APPROVED from 07/11/24 to 01/07/25   PA #/Case ID/Reference #: [id number]

## 2024-07-23 ENCOUNTER — Other Ambulatory Visit: Payer: Self-pay | Admitting: Family Medicine

## 2024-07-23 DIAGNOSIS — G4733 Obstructive sleep apnea (adult) (pediatric): Secondary | ICD-10-CM

## 2024-07-24 NOTE — Telephone Encounter (Signed)
 ZEPBOUND  5 MG/0.5ML Pen        Changed from: tirzepatide  5 MG/0.5ML injection vial   Pharmacy comment: Alternative Requested:PRIOR AUTH

## 2024-07-29 ENCOUNTER — Other Ambulatory Visit (HOSPITAL_COMMUNITY): Payer: Self-pay

## 2024-08-28 ENCOUNTER — Other Ambulatory Visit: Payer: Self-pay | Admitting: Family Medicine

## 2024-08-28 DIAGNOSIS — G4733 Obstructive sleep apnea (adult) (pediatric): Secondary | ICD-10-CM

## 2024-08-29 ENCOUNTER — Telehealth: Payer: Self-pay | Admitting: Family Medicine

## 2024-08-29 DIAGNOSIS — G4733 Obstructive sleep apnea (adult) (pediatric): Secondary | ICD-10-CM

## 2024-08-29 MED ORDER — TIRZEPATIDE-WEIGHT MANAGEMENT 5 MG/0.5ML ~~LOC~~ SOLN: 5.0000 mg | SUBCUTANEOUS | 1 refills | Status: AC

## 2024-08-29 NOTE — Telephone Encounter (Signed)
 Rx sent to pharmacy. Patient notified.

## 2024-08-29 NOTE — Telephone Encounter (Signed)
 Patient requesting refills on Tirzepatide  5 MG/0.5 ML. Okay to send refills?   Copied from CRM #[id number]. Topic: Clinical - Prescription Issue >> Aug 29, 2024 10:26 AM Donna BRAVO wrote: Reason for CRM:  Patient calling asking why this has been denied.   Pharmacy  your prescriber denied your request for tirzepatide  5 MG/0.5ML injection vial       Patient would like a call back regarding this issue

## 2024-08-29 NOTE — Telephone Encounter (Signed)
 Go ahead and refill the tirzepatide  5 mg for him.

## 2024-10-17 ENCOUNTER — Ambulatory Visit: Admitting: Family Medicine

## 2024-10-28 ENCOUNTER — Encounter: Payer: Self-pay | Admitting: Family Medicine
